# Patient Record
Sex: Female | Born: 1945 | Race: White | Hispanic: No | Marital: Married | Smoking: Former smoker
Health system: Southern US, Community
[De-identification: ages and names within clinical notes are randomized; demographics above are authoritative.]

## PROBLEM LIST (undated history)

## (undated) DIAGNOSIS — I82409 Acute embolism and thrombosis of unspecified deep veins of unspecified lower extremity: Secondary | ICD-10-CM

## (undated) DIAGNOSIS — F32A Depression, unspecified: Secondary | ICD-10-CM

## (undated) DIAGNOSIS — G629 Polyneuropathy, unspecified: Secondary | ICD-10-CM

## (undated) DIAGNOSIS — R112 Nausea with vomiting, unspecified: Secondary | ICD-10-CM

## (undated) DIAGNOSIS — M1712 Unilateral primary osteoarthritis, left knee: Secondary | ICD-10-CM

## (undated) DIAGNOSIS — M1990 Unspecified osteoarthritis, unspecified site: Secondary | ICD-10-CM

## (undated) DIAGNOSIS — J40 Bronchitis, not specified as acute or chronic: Secondary | ICD-10-CM

## (undated) DIAGNOSIS — K219 Gastro-esophageal reflux disease without esophagitis: Secondary | ICD-10-CM

## (undated) DIAGNOSIS — E785 Hyperlipidemia, unspecified: Secondary | ICD-10-CM

## (undated) DIAGNOSIS — F329 Major depressive disorder, single episode, unspecified: Secondary | ICD-10-CM

## (undated) DIAGNOSIS — J189 Pneumonia, unspecified organism: Secondary | ICD-10-CM

## (undated) DIAGNOSIS — C801 Malignant (primary) neoplasm, unspecified: Secondary | ICD-10-CM

## (undated) DIAGNOSIS — Z8719 Personal history of other diseases of the digestive system: Secondary | ICD-10-CM

## (undated) DIAGNOSIS — Z9889 Other specified postprocedural states: Secondary | ICD-10-CM

## (undated) HISTORY — PX: VAGINAL HYSTERECTOMY: SUR661

## (undated) HISTORY — PX: BREAST LUMPECTOMY: SHX2

## (undated) HISTORY — PX: TONSILLECTOMY: SUR1361

## (undated) HISTORY — PX: CHOLECYSTECTOMY: SHX55

## (undated) HISTORY — PX: BACK SURGERY: SHX140

## (undated) HISTORY — DX: Unilateral primary osteoarthritis, left knee: M17.12

## (undated) HISTORY — PX: JOINT REPLACEMENT: SHX530

---

## 2005-01-17 DIAGNOSIS — C801 Malignant (primary) neoplasm, unspecified: Secondary | ICD-10-CM

## 2005-01-17 HISTORY — DX: Malignant (primary) neoplasm, unspecified: C80.1

## 2007-08-27 ENCOUNTER — Inpatient Hospital Stay (HOSPITAL_COMMUNITY): Admission: RE | Admit: 2007-08-27 | Discharge: 2007-08-30 | Payer: Self-pay | Admitting: Orthopedic Surgery

## 2008-10-30 ENCOUNTER — Encounter: Admission: RE | Admit: 2008-10-30 | Discharge: 2008-10-30 | Payer: Self-pay | Admitting: Orthopedic Surgery

## 2009-07-03 ENCOUNTER — Ambulatory Visit (HOSPITAL_COMMUNITY): Admission: RE | Admit: 2009-07-03 | Discharge: 2009-07-03 | Payer: Self-pay | Admitting: Neurological Surgery

## 2009-09-14 ENCOUNTER — Ambulatory Visit (HOSPITAL_COMMUNITY): Admission: RE | Admit: 2009-09-14 | Discharge: 2009-09-15 | Payer: Self-pay | Admitting: Neurological Surgery

## 2010-02-07 ENCOUNTER — Encounter: Payer: Self-pay | Admitting: Orthopedic Surgery

## 2010-04-02 LAB — CBC
Hemoglobin: 14.1 g/dL (ref 12.0–15.0)
MCHC: 35 g/dL (ref 30.0–36.0)
MCV: 91.4 fL (ref 78.0–100.0)

## 2010-06-01 NOTE — Op Note (Signed)
Jeanne Cummings, Jeanne Cummings                ACCOUNT NO.:  [id number]   MEDICAL RECORD NO.:  [id number]          PATIENT TYPE:  INP   LOCATION:  5005                         FACILITY:  [facility]   PHYSICIAN:  Robert A. Thurston Hole, M.D. DATE OF BIRTH:  1945/08/23   DATE OF PROCEDURE:  08/27/2007  DATE OF DISCHARGE:                               OPERATIVE REPORT   PREOPERATIVE DIAGNOSIS:  Right knee degenerative joint disease.   POSTOPERATIVE DIAGNOSIS:  Right knee degenerative joint disease.   PROCEDURE:  Right total knee replacement using DePuy cemented total knee  system with #3 cemented femur, #4 cemented tibia with 12.5-mm  polyethylene RP tibial spacer and 35-mm polyethylene cemented patella.   SURGEON:  Elana Alm. Thurston Hole, MD   ASSISTANT:  Julien Girt, PA   ANESTHESIA:  General.   OPERATIVE TIME:  1 hour and 20 minutes.   COMPLICATIONS:  None.   DESCRIPTION OF PROCEDURE:  Jeanne Cummings was brought to the operating room  on August 27, 2007, after a femoral nerve block was placed in the  holding area by anesthesia.  She was placed on the operative table in  supine position.  Her right knee was examined under anesthesia.  Range  of motions from -5 to 125 degrees, mild varus deformity, and a stable  ligamentous exam with normal patellar tracking.  She had a Foley  catheter placed under sterile conditions.  Her right leg was then  prepped using sterile DuraPrep and draped using sterile technique.  She  received Ancef 2 grams IV preoperatively for prophylaxis.  The leg was  exsanguinated and a thigh tourniquet elevated to 375 mm.  Initially,  through a 16-cm longitudinal incision based over the patella, initial  exposure was made.  The underlying subcutaneous tissues were incised  along the skin incision.  A median arthrotomy was performed revealing an  excessive amount of normal-appearing joint fluid.  The articular  surfaces were inspected.  She had grade 4 changes medially, grade 3  changes laterally, and grade 3 and 4 changes in the patellofemoral  joint.  Osteophytes removed from the femoral condyles and tibial  plateau.  The medial and lateral meniscal remnants were removed as well  as the anterior cruciate ligament.  Intramedullary drill was then  drilled up the femoral canal for placement of distal femoral cutting  jig, which was placed in the appropriate manner by rotation and a distal  11 mm cut was made.  The distal femur was incised.  The #3 was found to  be the appropriate size.  The #3 cutting jig was placed in the  appropriate manner by external rotation and then these cute were made.  The proximal tibia was then exposed.  The tibial spines were removed  with an oscillating saw.  An intramedullary drill was then drilled down  the tibial canal for placement of proximal tibial cutting jig, which was  placed in the appropriate manner by rotation and a proximal tibial cut  was made taking 6-mm off the medial of lower side.  After this was done,  spacer blocks were placed in flexion  and extension.  The 12.5 mm blocks  gave excellent balancing, excellent stability, and excellent correction  of her flexion and varus deformities.  At this point, the #4 tibial base  plate trial was placed on the cut tibial surface with an excellent fit  and the keel cut was made.  The PCL box cutter was then placed on the  distal femur and these cuts were made.  At this point, the #3 femoral  trial was placed and with the #4 tibial base plate trial and a 12.5-mm  polyethylene RP tibial spacer, knee was reduced, taken through a range  of motion from 0-125 degrees of excellent stability and excellent  correction of her flexion, varus deformities and normal patellar  tracking.  Resurfacing 9-mm cut was then made for the patella and 3  locking holes placed for a 35-mm patella.  The patellar trial was  placed, again patellofemoral tracking was evaluated and found to be  normal.  At this  point, it was felt that all the trial components were  of excellent size, fit, and stability.  They were then removed.  The  knee was then jet lavaged, irrigated with 3 liters of saline.  The  proximal tibia was then exposed.  The #4 tibial base plate with cement  backing was hammered into position with an excellent fit with excess  cement being removed from around the edges.  The #3 femoral component  with cement backing was hammered into position also with an excellent  fit with excess cement being removed from around the edges.  The 12.5-mm  polyethylene RP tibial spacer was placed on the tibial base plate.  The  knee reduced, taken through a range of motion from 0-125 degrees with  excellent stability and excellent correction of her flexion and varus  deformities.  The 35-mm polyethylene cement backed patella was then  placed in its position and held there with a clamp.  After the cement  hardened, again, patellofemoral tracking was evaluated and found to be  normal.  At this point, it was felt that all the components were of  excellent size, fit, and stability.  The wound was further irrigated  with saline and then the tourniquet was released.  Hemostasis obtained  with cautery.  The arthrotomy was then closed with #1 Ethibond suture  over two medium Hemovac drains.  Subcutaneous tissue is closed with 0  and 2-0 Vicryl, subcuticular layer closed with 4-0 Monocryl.  Sterile  dressings and a long-leg splint applied.  The patient then awakened,  extubated, and taken to recovery room in stable condition.  Needle and  sponge counts were correct x2 at the end the case.  Neurovascular status  normal postoperatively as well.      Robert A. Thurston Hole, M.D.  Electronically Signed     RAW/MEDQ  D:  08/27/2007  T:  08/28/2007  Job:  [id number]

## 2010-06-04 NOTE — Discharge Summary (Signed)
NAMEDANIELE, DILLOW                ACCOUNT NO.:  [id number]   MEDICAL RECORD NO.:  [id number]          PATIENT TYPE:  INP   LOCATION:  5005                         FACILITY:  [facility]   PHYSICIAN:  Robert A. Thurston Hole, M.D. DATE OF BIRTH:  [birth year]   DATE OF ADMISSION:  08/27/2007  DATE OF DISCHARGE:  08/30/2007                               DISCHARGE SUMMARY   ADMITTING DIAGNOSES:  1. End-stage degenerative joint disease, right knee.  2. Depression.  3. History of breast cancer.  4. Hypertension.   DISCHARGE DIAGNOSES:  1. End-stage degenerative joint disease, right knee status post right      total knee replacement.  2. Depression.  3. History of breast cancer.  4. Hypertension.  5. Postoperative blood loss anemia.  6. Family history of heart disease.  7. Gastroesophageal reflux disease.  8. Obesity.  9. Hyperlipidemia.  10.History of a deep venous thrombosis.   HISTORY OF PRESENT ILLNESS:  The patient is a 65 year old white female  with a history of end-stage DJD of the right knee.  She has failed  conservative care including anti-inflammatories and intra-articular  cortisone injections.  Risks, benefits, and possible complications of a  right total knee replacement have been discussed with the patient and  her husband.  She is without question.   PROCEDURES:  In-house on August 27, 2007, the patient underwent a right  total knee replacement by Dr. Wyline Mood.  A right femoral nerve block by  anesthesia and Autovac transfusion.  She tolerated all 3 of these  procedures well and was admitted postoperatively for pain control, DVT  prophylaxis, and physical therapy.  Postop day 1, the patient's  hemoglobin was stable at 11.1.  INR was 1.1.  Temperature was 97.4.  Her  PCA was discontinued.  Her Foley was discontinued.  Her IV was saline  locked.  She got up with physical therapy, ambulated 50 feet, still  having a moderate amount of pain, unable to tolerate physical therapy in  the afternoon.  On postop day 2, the patient's hemoglobin was 10.3, INR  was 1.4, and she was metabolically stable and afebrile, tolerating a CPM  0-70 degrees.  She was given a Dulcolax suppository.  Physical therapy:  She was able to ambulate 50 feet in the morning, 80  feet in the afternoon.  On postop day 3, the patient significantly improved, alert and oriented,  pain under control with p.o. pain medicine.  She is being discharged to  home in stable condition, weightbearing as tolerated on a regular diet.   No instructions on daily dressing changes.  She will get home health  physical therapy, home health occupational therapy, and home health  nursing.  She will follow up in our office on September 10, 2007.      Kirstin Shepperson, P.A.      Robert A. Thurston Hole, M.D.  Electronically Signed    KS/MEDQ  D:  10/24/2007  T:  10/25/2007  Job:  [id number]

## 2010-09-27 ENCOUNTER — Other Ambulatory Visit (HOSPITAL_COMMUNITY): Payer: Self-pay

## 2010-10-04 ENCOUNTER — Inpatient Hospital Stay (HOSPITAL_COMMUNITY): Admission: RE | Admit: 2010-10-04 | Payer: Medicare Other | Source: Ambulatory Visit | Admitting: Neurological Surgery

## 2010-10-15 LAB — CBC
HCT: 29.6 — ABNORMAL LOW
HCT: 31.9 — ABNORMAL LOW
Hemoglobin: 10.3 — ABNORMAL LOW
MCHC: 34.8
MCHC: 34.9
MCV: 92.5
Platelets: 233
RBC: 3.26 — ABNORMAL LOW
RBC: 3.28 — ABNORMAL LOW
RDW: 12.6
WBC: 10.6 — ABNORMAL HIGH
WBC: 6.6
WBC: 7.4

## 2010-10-15 LAB — COMPREHENSIVE METABOLIC PANEL
Albumin: 3.8
CO2: 26
Calcium: 9.1
Chloride: 104
Creatinine, Ser: 1.23 — ABNORMAL HIGH
GFR calc Af Amer: 54 — ABNORMAL LOW
GFR calc non Af Amer: 44 — ABNORMAL LOW
Potassium: 3.8
Sodium: 137
Total Bilirubin: 0.4
Total Protein: 7

## 2010-10-15 LAB — URINE CULTURE

## 2010-10-15 LAB — BASIC METABOLIC PANEL
CO2: 28
CO2: 29
Calcium: 9.3
Chloride: 102
Chloride: 96
Creatinine, Ser: 0.98
GFR calc non Af Amer: 48 — ABNORMAL LOW
Potassium: 3.9
Potassium: 4.6
Sodium: 135

## 2010-10-15 LAB — URINALYSIS, ROUTINE W REFLEX MICROSCOPIC
Hgb urine dipstick: NEGATIVE
Protein, ur: NEGATIVE
Specific Gravity, Urine: 1.013
pH: 5.5

## 2010-10-15 LAB — DIFFERENTIAL
Eosinophils Relative: 4
Lymphocytes Relative: 32
Lymphs Abs: 1.6
Monocytes Absolute: 0.5
Monocytes Relative: 10
Neutro Abs: 2.7

## 2010-10-15 LAB — PROTIME-INR
INR: 0.9
INR: 1.4
Prothrombin Time: 19.1 — ABNORMAL HIGH

## 2010-10-15 LAB — TYPE AND SCREEN
ABO/RH(D): O POS
Antibody Screen: NEGATIVE

## 2010-10-15 LAB — ABO/RH: ABO/RH(D): O POS

## 2011-02-28 ENCOUNTER — Other Ambulatory Visit: Payer: Self-pay | Admitting: Neurological Surgery

## 2011-03-21 NOTE — Pre-Procedure Instructions (Signed)
20 Jeanne Cummings  03/21/2011   Your procedure is scheduled on:  Monday March 28, 2011 at 1129 am.  Report to [facility] at 0830  AM.  Call this number if you have problems the morning of surgery: [phone]   Remember:   Do not eat food:After Midnight.  May have clear liquids: up to 4 Hours before arrival until 0430 am.  Clear liquids include soda, tea, black coffee, apple or grape juice, broth.  Take these medicines the morning of surgery with A SIP OF WATER:    Do not wear jewelry, make-up or nail polish.  Do not wear lotions, powders, or perfumes. You may wear deodorant.  Do not shave 48 hours prior to surgery.  Do not bring valuables to the hospital.  Contacts, dentures or bridgework may not be worn into surgery.  Leave suitcase in the car. After surgery it may be brought to your room.  For patients admitted to the hospital, checkout time is 11:00 AM the day of discharge.   Patients discharged the day of surgery will not be allowed to drive home.  Name and phone number of your driver:   Special Instructions: CHG Shower Use Special Wash: 1/2 bottle night before surgery and 1/2 bottle morning of surgery.   Please read over the following fact sheets that you were given: Pain Booklet, Coughing and Deep Breathing, Blood Transfusion Information and Surgical Site Infection Prevention

## 2011-03-22 ENCOUNTER — Encounter (HOSPITAL_COMMUNITY): Payer: Self-pay

## 2011-03-22 ENCOUNTER — Encounter (HOSPITAL_COMMUNITY)
Admission: RE | Admit: 2011-03-22 | Discharge: 2011-03-22 | Disposition: A | Discharge status: Home / Self Care | Payer: Medicare Other | Source: Ambulatory Visit | Attending: Neurological Surgery | Admitting: Neurological Surgery

## 2011-03-22 DIAGNOSIS — Z01818 Encounter for other preprocedural examination: Secondary | ICD-10-CM | POA: Insufficient documentation

## 2011-03-22 DIAGNOSIS — Z01812 Encounter for preprocedural laboratory examination: Secondary | ICD-10-CM | POA: Insufficient documentation

## 2011-03-22 HISTORY — DX: Bronchitis, not specified as acute or chronic: J40

## 2011-03-22 HISTORY — DX: Other specified postprocedural states: Z98.890

## 2011-03-22 HISTORY — DX: Personal history of other diseases of the digestive system: Z87.19

## 2011-03-22 HISTORY — DX: Depression, unspecified: F32.A

## 2011-03-22 HISTORY — DX: Gastro-esophageal reflux disease without esophagitis: K21.9

## 2011-03-22 HISTORY — DX: Acute embolism and thrombosis of unspecified deep veins of unspecified lower extremity: I82.409

## 2011-03-22 HISTORY — DX: Polyneuropathy, unspecified: G62.9

## 2011-03-22 HISTORY — DX: Other specified postprocedural states: R11.2

## 2011-03-22 HISTORY — DX: Major depressive disorder, single episode, unspecified: F32.9

## 2011-03-22 HISTORY — DX: Hyperlipidemia, unspecified: E78.5

## 2011-03-22 HISTORY — DX: Pneumonia, unspecified organism: J18.9

## 2011-03-22 HISTORY — DX: Malignant (primary) neoplasm, unspecified: C80.1

## 2011-03-22 HISTORY — DX: Unspecified osteoarthritis, unspecified site: M19.90

## 2011-03-22 LAB — CBC
MCV: 89.8 fL (ref 78.0–100.0)
Platelets: 283 10*3/uL (ref 150–400)
RBC: 4.5 MIL/uL (ref 3.87–5.11)
WBC: 7.9 10*3/uL (ref 4.0–10.5)

## 2011-03-22 LAB — TYPE AND SCREEN

## 2011-03-22 LAB — SURGICAL PCR SCREEN: Staphylococcus aureus: NEGATIVE

## 2011-03-22 NOTE — Progress Notes (Signed)
Pt informed Nurse that she had a stress test, EKG, and Echo at [facility] in [place], [place] but all results were at PCP office Dr. Reed Breech. Results requested. EKG and Chest Xray were not ordered due to pts denial of having any cardiac or lung issues and also because pt was not currently on any medications for cardiac or lung disease.

## 2011-03-23 NOTE — Progress Notes (Signed)
Spoke with Tabitha at Dr Ingledue's office and she said she could not find any cardiac records on this pt.  After informing her that the records were originally sent from a clinic in [place], she stated she will search further and try and locate them.

## 2011-03-28 ENCOUNTER — Encounter (HOSPITAL_COMMUNITY): Admission: RE | Payer: Self-pay | Source: Ambulatory Visit

## 2011-03-28 ENCOUNTER — Inpatient Hospital Stay (HOSPITAL_COMMUNITY): Admission: RE | Admit: 2011-03-28 | Payer: Medicare Other | Source: Ambulatory Visit | Admitting: Neurological Surgery

## 2011-03-28 SURGERY — POSTERIOR LUMBAR FUSION 3 LEVEL
Anesthesia: General | Site: Back

## 2011-04-20 ENCOUNTER — Encounter (HOSPITAL_COMMUNITY): Payer: Self-pay | Admitting: Pharmacy Technician

## 2011-04-21 ENCOUNTER — Other Ambulatory Visit: Payer: Self-pay | Admitting: Physician Assistant

## 2011-04-21 ENCOUNTER — Encounter: Payer: Self-pay | Admitting: Physician Assistant

## 2011-04-21 DIAGNOSIS — J40 Bronchitis, not specified as acute or chronic: Secondary | ICD-10-CM

## 2011-04-21 DIAGNOSIS — G629 Polyneuropathy, unspecified: Secondary | ICD-10-CM

## 2011-04-21 DIAGNOSIS — F32A Depression, unspecified: Secondary | ICD-10-CM

## 2011-04-21 DIAGNOSIS — M1990 Unspecified osteoarthritis, unspecified site: Secondary | ICD-10-CM

## 2011-04-21 DIAGNOSIS — Z8719 Personal history of other diseases of the digestive system: Secondary | ICD-10-CM

## 2011-04-21 DIAGNOSIS — K219 Gastro-esophageal reflux disease without esophagitis: Secondary | ICD-10-CM

## 2011-04-21 DIAGNOSIS — J189 Pneumonia, unspecified organism: Secondary | ICD-10-CM

## 2011-04-21 DIAGNOSIS — F329 Major depressive disorder, single episode, unspecified: Secondary | ICD-10-CM

## 2011-04-21 DIAGNOSIS — C801 Malignant (primary) neoplasm, unspecified: Secondary | ICD-10-CM

## 2011-04-21 DIAGNOSIS — R112 Nausea with vomiting, unspecified: Secondary | ICD-10-CM

## 2011-04-21 DIAGNOSIS — Z9889 Other specified postprocedural states: Secondary | ICD-10-CM

## 2011-04-21 DIAGNOSIS — I82409 Acute embolism and thrombosis of unspecified deep veins of unspecified lower extremity: Secondary | ICD-10-CM

## 2011-04-21 DIAGNOSIS — E785 Hyperlipidemia, unspecified: Secondary | ICD-10-CM

## 2011-04-21 DIAGNOSIS — M1712 Unilateral primary osteoarthritis, left knee: Secondary | ICD-10-CM

## 2011-04-21 NOTE — H&P (Signed)
ANEA FODERA is an 66 y.o. female.   Chief Complaint: left knee end stage DJD HPI: Doshie is a 66 year old who comes in for evaluation of her left knee. She has a longstanding history of left knee degenerative joint disease. She's tried interarticular cortisone and Supartz injections. She's using a cane to ambulate due to significant pain and weakness. She requires assistance to rise from chair and has difficulty sleeping at night.  The excruciating pain is getting progressively worse, limiting activities of daily living, poses a significant fall risk, and has failed multiple conservative treatments including intraarticular cortisone injections, intraarticular Supartz, bracing, medication and home physical therapy.  Past Medical History  Diagnosis Date  . PONV (postoperative nausea and vomiting)   . DVT, lower extremity     Left leg  . Bronchitis     hx of  . Pneumonia     hx of  . Depression   . Cancer 2007    Beast cancer. Chemo & radiation  . H/O hiatal hernia   . GERD (gastroesophageal reflux disease)     Takes Zantac  . Constipation     hx of  . Neuropathy     Bilateral feet  . Arthritis   . Hyperlipidemia     No meds due to adverse affects  . Left knee DJD     Past Surgical History  Procedure Date  . Vaginal hysterectomy   . Tonsillectomy   . Joint replacement     right knee replacement  . Back surgery     Cervical surgery  . Breast lumpectomy     x 3    Family History  Problem Relation Age of Onset  . Anesthesia problems Neg Hx   . Hypotension Neg Hx   . Malignant hyperthermia Neg Hx   . Pseudochol deficiency Neg Hx   . Bone cancer Father   . Arthritis Sister   . Fibromyalgia Sister   . Interstitial cystitis Sister    Social History:  reports that she quit smoking about 31 years ago. She has never used smokeless tobacco. She reports that she drinks about 1.8 ounces of alcohol per week. She reports that she does not use illicit drugs.  Allergies:    Allergies  Allergen Reactions  . Lidocaine   . Novacare Or   . Oxycodone-Acetaminophen   . Reglan     Medications Prior to Admission  Medication Sig Dispense Refill  . Cyanocobalamin (VITAMIN B 12 PO) Inject 5 mg as directed every 30 (thirty) days.      . diclofenac-misoprostol (ARTHROTEC 75) 75-200 MG-MCG per tablet Take 1 tablet by mouth 2 (two) times daily.      Marland Kitchen FOLIC ACID PO Take 1 tablet by mouth daily. 600 mcg      . magnesium oxide (MAG-OX) 400 MG tablet Take 400 mg by mouth every evening.      . RABEprazole (ACIPHEX) 20 MG tablet Take 20 mg by mouth every evening.      . ranitidine (ZANTAC) 150 MG tablet Take 150 mg by mouth every morning.      . venlafaxine (EFFEXOR-XR) 75 MG 24 hr capsule Take 75 mg by mouth 2 (two) times daily.      . Vitamin D, Ergocalciferol, (DRISDOL) 50000 UNITS CAPS Take 50,000 Units by mouth every 7 (seven) days. thursdays      . OVER THE COUNTER MEDICATION Take 5 tablets by mouth daily. Organic triple fiber pills.  Pt takes all 5 tabs  at once       No current facility-administered medications on file as of 04/21/2011.    No results found for this or any previous visit (from the past 48 hour(s)). No results found.  Review of Systems  Constitutional: Negative.   HENT: Negative.   Eyes: Negative.   Respiratory: Negative.   Cardiovascular: Negative.   Gastrointestinal: Positive for heartburn and constipation. Negative for nausea, vomiting, abdominal pain, diarrhea, blood in stool and melena.  Genitourinary: Negative.   Musculoskeletal: Positive for back pain and joint pain.       DDD back    Left knee pain  Skin: Negative.   Neurological: Negative.   Endo/Heme/Allergies: Positive for polydipsia. Negative for environmental allergies. Does not bruise/bleed easily.  Psychiatric/Behavioral: Negative.     Blood pressure 153/99, pulse 78, temperature 98.3 F (36.8 C), height 5\' 4"  (1.626 m), weight 105.235 kg (232 lb), SpO2 98.00%. Physical Exam   Constitutional: She is oriented to person, place, and time. She appears well-developed and well-nourished.  HENT:  Head: Normocephalic and atraumatic.  Mouth/Throat: Oropharynx is clear and moist.  Eyes: EOM are normal. Pupils are equal, round, and reactive to light.  Neck: Normal range of motion. Neck supple.  Cardiovascular: Normal rate and regular rhythm.   Respiratory: Effort normal and breath sounds normal.  GI: Soft. Bowel sounds are normal.  Genitourinary:       Not pertinent to current symptomatology therefore not examined.  Musculoskeletal: Normal range of motion.       Left knee has active range of motion -10 to 95 degrees 3+ crepitus 2+ synovitis medial joint line tenderness she has ipsilateral straight leg raise on the left with left leg radiculopathy she has left quad weakness compared to the right quad with 4/5 strength on the left knee right knee has 5/5 strength right knee has active range of motion 0-120 degrees well healed total knee incision 2+ dorsalis pedis pulses. She has decreased sensation in the left lower extremity compared to the right but intact to light touch.  Neurological: She is alert and oriented to person, place, and time.  Skin: Skin is warm and dry.  Psychiatric: She has a normal mood and affect. Her behavior is normal. Judgment and thought content normal.      Xray: AP,LATERAL, FLEXION, and SUNRISE views of the left knee show significant joint space narrowing, with periarticular osteophytes, and subchondral sclerosis.  Assessment Past Medical History  Diagnosis Date  . PONV (postoperative nausea and vomiting)   . DVT, lower extremity     Left leg  . Bronchitis     hx of  . Pneumonia     hx of  . Depression   . Cancer 2007    Beast cancer. Chemo & radiation  . H/O hiatal hernia   . GERD (gastroesophageal reflux disease)     Takes Zantac  . Constipation     hx of  . Neuropathy     Bilateral feet  . Arthritis   . Hyperlipidemia     No  meds due to adverse affects  . Left knee DJD      Plan At this point in time, she has significant antalgic gait ambulates with assistance of a cane has failed conservative treatment with injections and Supartz as well as home physical therapy we recommend left total knee replacement. Prior to total knee replacement she would be a excellent candidate for epidural steroid injections on the left or nerve block to control left  leg radiculopathy.   Aitanna Haubner J 04/21/2011, 11:27 AM

## 2011-04-28 ENCOUNTER — Encounter (HOSPITAL_COMMUNITY)
Admission: RE | Admit: 2011-04-28 | Discharge: 2011-04-28 | Disposition: A | Discharge status: Home / Self Care | Payer: Medicare Other | Source: Ambulatory Visit | Attending: Orthopedic Surgery | Admitting: Orthopedic Surgery

## 2011-04-28 ENCOUNTER — Encounter (HOSPITAL_COMMUNITY): Payer: Self-pay

## 2011-04-28 ENCOUNTER — Encounter (HOSPITAL_COMMUNITY)
Admission: RE | Admit: 2011-04-28 | Discharge: 2011-04-28 | Disposition: A | Discharge status: Home / Self Care | Payer: Medicare Other | Source: Ambulatory Visit | Attending: Physician Assistant | Admitting: Physician Assistant

## 2011-04-28 LAB — COMPREHENSIVE METABOLIC PANEL
Albumin: 3.6 g/dL (ref 3.5–5.2)
BUN: 20 mg/dL (ref 6–23)
Calcium: 9.2 mg/dL (ref 8.4–10.5)
Chloride: 99 mEq/L (ref 96–112)
Creatinine, Ser: 1.03 mg/dL (ref 0.50–1.10)
Total Bilirubin: 0.3 mg/dL (ref 0.3–1.2)
Total Protein: 6.7 g/dL (ref 6.0–8.3)

## 2011-04-28 LAB — URINALYSIS, ROUTINE W REFLEX MICROSCOPIC
Glucose, UA: NEGATIVE mg/dL
Hgb urine dipstick: NEGATIVE
Ketones, ur: NEGATIVE mg/dL
Leukocytes, UA: NEGATIVE
Protein, ur: NEGATIVE mg/dL

## 2011-04-28 LAB — DIFFERENTIAL
Basophils Relative: 0 % (ref 0–1)
Monocytes Relative: 9 % (ref 3–12)
Neutro Abs: 4.3 10*3/uL (ref 1.7–7.7)
Neutrophils Relative %: 58 % (ref 43–77)

## 2011-04-28 LAB — PROTIME-INR: INR: 0.93 (ref 0.00–1.49)

## 2011-04-28 LAB — CBC
HCT: 41.8 % (ref 36.0–46.0)
MCH: 30.5 pg (ref 26.0–34.0)
MCHC: 33.7 g/dL (ref 30.0–36.0)
MCV: 90.3 fL (ref 78.0–100.0)
RDW: 13.1 % (ref 11.5–15.5)

## 2011-04-28 LAB — TYPE AND SCREEN: ABO/RH(D): O POS

## 2011-04-28 NOTE — Pre-Procedure Instructions (Signed)
20 Jeanne Cummings  04/28/2011   Your procedure is scheduled on:  05/02/11  Report to [facility] at 830 AM.  Call this number if you have problems the morning of surgery: [phone]   Remember:   Do not eat food:After Midnight.  May have clear liquids: up to 4 Hours before arrival.  Clear liquids include soda, tea, black coffee, apple or grape juice, broth.  Take these medicines the morning of surgery with A SIP OF WATER: *aciphex,zantac,effexor  Do not wear jewelry, make-up or nail polish.  Do not wear lotions, powders, or perfumes. You may wear deodorant.  Do not shave 48 hours prior to surgery.  Do not bring valuables to the hospital.  Contacts, dentures or bridgework may not be worn into surgery.  Leave suitcase in the car. After surgery it may be brought to your room.  For patients admitted to the hospital, checkout time is 11:00 AM the day of discharge.   Patients discharged the day of surgery will not be allowed to drive home.  Name and phone number of your driver: family  Special Instructions: CHG Shower Use Special Wash: 1/2 bottle night before surgery and 1/2 bottle morning of surgery.   Please read over the following fact sheets that you were given: Pain Booklet, Coughing and Deep Breathing, Blood Transfusion Information, MRSA Information and Surgical Site Infection Prevention

## 2011-04-29 LAB — URINE CULTURE: Culture  Setup Time: 201304111540

## 2011-04-29 NOTE — Consult Note (Signed)
Anesthesia:  Patient is a 66 year old female scheduled for left TKR on 05/02/11.  History includes former smoker, post-operative N/V, depression, HH, arthritis, GERD, HLD, history of PNA and bronchitis, breast cancer s/p chemo/radiation '07, history of LLE DVT, peripheral neuropathy, obesity with BMI of 40. S/p C5-6 ACDF August 2011.  Her BP was elevated at 153/99 at her Ortho appointment last week, but was improved at 132/78 at her PAT appointment.  She did not report a history of HTN.  Labs noted.  Urine culture is still pending.    CXR on 04/28/11 showed no active cardiopulmonary disease.  EKG on 04/28/11 showed NSR, poor r wave progression.  It was not felt significantly changed since 09/14/09.  Anticipate she can proceed as planned.

## 2011-05-01 MED ORDER — POVIDONE-IODINE 7.5 % EX SOLN
Freq: Once | CUTANEOUS | Status: DC
  Filled 2011-05-01: qty 118

## 2011-05-01 MED ORDER — CEFAZOLIN SODIUM-DEXTROSE 2-3 GM-% IV SOLR
2.0000 g | INTRAVENOUS | Status: AC
  Administered 2011-05-02: 2 g via INTRAVENOUS
  Filled 2011-05-01: qty 50

## 2011-05-01 MED ORDER — CHLORHEXIDINE GLUCONATE 4 % EX LIQD: 60.0000 mL | Freq: Once | CUTANEOUS | Status: DC

## 2011-05-02 ENCOUNTER — Ambulatory Visit (HOSPITAL_COMMUNITY): Payer: Medicare Other | Admitting: Vascular Surgery

## 2011-05-02 ENCOUNTER — Encounter (HOSPITAL_COMMUNITY): Payer: Self-pay | Admitting: *Deleted

## 2011-05-02 ENCOUNTER — Encounter (HOSPITAL_COMMUNITY): Payer: Self-pay | Admitting: Vascular Surgery

## 2011-05-02 ENCOUNTER — Inpatient Hospital Stay (HOSPITAL_COMMUNITY)
Admission: RE | Admit: 2011-05-02 | Discharge: 2011-05-05 | DRG: 470 | Disposition: A | Discharge status: Home Health | Payer: Medicare Other | Source: Ambulatory Visit | Attending: Orthopedic Surgery | Admitting: Orthopedic Surgery

## 2011-05-02 ENCOUNTER — Encounter (HOSPITAL_COMMUNITY)
Admission: RE | Disposition: A | Discharge status: Home Health | Payer: Self-pay | Source: Ambulatory Visit | Attending: Orthopedic Surgery

## 2011-05-02 DIAGNOSIS — M1990 Unspecified osteoarthritis, unspecified site: Secondary | ICD-10-CM | POA: Diagnosis present

## 2011-05-02 DIAGNOSIS — Z853 Personal history of malignant neoplasm of breast: Secondary | ICD-10-CM

## 2011-05-02 DIAGNOSIS — I82409 Acute embolism and thrombosis of unspecified deep veins of unspecified lower extremity: Secondary | ICD-10-CM | POA: Clinically undetermined

## 2011-05-02 DIAGNOSIS — Z01812 Encounter for preprocedural laboratory examination: Secondary | ICD-10-CM

## 2011-05-02 DIAGNOSIS — M1712 Unilateral primary osteoarthritis, left knee: Secondary | ICD-10-CM | POA: Diagnosis present

## 2011-05-02 DIAGNOSIS — Z9889 Other specified postprocedural states: Secondary | ICD-10-CM | POA: Insufficient documentation

## 2011-05-02 DIAGNOSIS — G589 Mononeuropathy, unspecified: Secondary | ICD-10-CM | POA: Diagnosis present

## 2011-05-02 DIAGNOSIS — J385 Laryngeal spasm: Secondary | ICD-10-CM | POA: Diagnosis present

## 2011-05-02 DIAGNOSIS — I10 Essential (primary) hypertension: Secondary | ICD-10-CM | POA: Diagnosis present

## 2011-05-02 DIAGNOSIS — E785 Hyperlipidemia, unspecified: Secondary | ICD-10-CM | POA: Diagnosis present

## 2011-05-02 DIAGNOSIS — D62 Acute posthemorrhagic anemia: Secondary | ICD-10-CM | POA: Diagnosis not present

## 2011-05-02 DIAGNOSIS — F3289 Other specified depressive episodes: Secondary | ICD-10-CM | POA: Diagnosis present

## 2011-05-02 DIAGNOSIS — F329 Major depressive disorder, single episode, unspecified: Secondary | ICD-10-CM | POA: Diagnosis present

## 2011-05-02 DIAGNOSIS — G629 Polyneuropathy, unspecified: Secondary | ICD-10-CM | POA: Diagnosis present

## 2011-05-02 DIAGNOSIS — K219 Gastro-esophageal reflux disease without esophagitis: Secondary | ICD-10-CM | POA: Diagnosis present

## 2011-05-02 DIAGNOSIS — M1710 Unilateral primary osteoarthritis, unspecified knee: Principal | ICD-10-CM | POA: Diagnosis present

## 2011-05-02 DIAGNOSIS — R609 Edema, unspecified: Secondary | ICD-10-CM | POA: Diagnosis present

## 2011-05-02 HISTORY — PX: TOTAL KNEE ARTHROPLASTY: SHX125

## 2011-05-02 SURGERY — ARTHROPLASTY, KNEE, TOTAL
Anesthesia: General | Site: Knee | Laterality: Left | Wound class: Clean

## 2011-05-02 MED ORDER — DEXAMETHASONE SODIUM PHOSPHATE 4 MG/ML IJ SOLN
INTRAMUSCULAR | Status: DC | PRN
  Administered 2011-05-02: 8 mg via INTRAVENOUS

## 2011-05-02 MED ORDER — PHENOL 1.4 % MT LIQD
1.0000 | OROMUCOSAL | Status: DC | PRN
  Administered 2011-05-03: 1 via OROMUCOSAL
  Filled 2011-05-02: qty 177

## 2011-05-02 MED ORDER — DROPERIDOL 2.5 MG/ML IJ SOLN
INTRAMUSCULAR | Status: DC | PRN
  Administered 2011-05-02: 0.625 mg via INTRAVENOUS

## 2011-05-02 MED ORDER — FENTANYL CITRATE 0.05 MG/ML IJ SOLN
INTRAMUSCULAR | Status: DC | PRN
  Administered 2011-05-02 (×2): 50 ug via INTRAVENOUS
  Administered 2011-05-02 (×2): 100 ug via INTRAVENOUS

## 2011-05-02 MED ORDER — WARFARIN SODIUM 5 MG PO TABS
5.0000 mg | ORAL_TABLET | Freq: Once | ORAL | Status: AC
  Administered 2011-05-02: 5 mg via ORAL
  Filled 2011-05-02: qty 1

## 2011-05-02 MED ORDER — NEOSTIGMINE METHYLSULFATE 1 MG/ML IJ SOLN
INTRAMUSCULAR | Status: DC | PRN
  Administered 2011-05-02: 2 mg via INTRAVENOUS

## 2011-05-02 MED ORDER — ONDANSETRON HCL 4 MG/2ML IJ SOLN
INTRAMUSCULAR | Status: DC | PRN
  Administered 2011-05-02: 4 mg via INTRAVENOUS

## 2011-05-02 MED ORDER — ACETAMINOPHEN 650 MG RE SUPP: 650.0000 mg | Freq: Four times a day (QID) | RECTAL | Status: DC | PRN

## 2011-05-02 MED ORDER — ESMOLOL HCL 10 MG/ML IV SOLN
INTRAVENOUS | Status: DC | PRN
  Administered 2011-05-02: 10 mg via INTRAVENOUS
  Administered 2011-05-02: 20 mg via INTRAVENOUS
  Administered 2011-05-02 (×2): 10 mg via INTRAVENOUS
  Administered 2011-05-02: 20 mg via INTRAVENOUS
  Administered 2011-05-02 (×2): 10 mg via INTRAVENOUS
  Administered 2011-05-02: 20 mg via INTRAVENOUS

## 2011-05-02 MED ORDER — PROMETHAZINE HCL 25 MG/ML IJ SOLN: 6.2500 mg | Freq: Once | INTRAMUSCULAR | Status: DC

## 2011-05-02 MED ORDER — ACETAMINOPHEN 10 MG/ML IV SOLN
INTRAVENOUS | Status: AC
  Filled 2011-05-02: qty 100

## 2011-05-02 MED ORDER — HYDROMORPHONE HCL PF 1 MG/ML IJ SOLN: 1.0000 mg | INTRAMUSCULAR | Status: DC | PRN

## 2011-05-02 MED ORDER — MAGNESIUM OXIDE 400 MG PO TABS
400.0000 mg | ORAL_TABLET | Freq: Every evening | ORAL | Status: DC
  Administered 2011-05-02 – 2011-05-04 (×3): 400 mg via ORAL
  Filled 2011-05-02 (×4): qty 1

## 2011-05-02 MED ORDER — METOCLOPRAMIDE HCL 5 MG/ML IJ SOLN: 5.0000 mg | Freq: Three times a day (TID) | INTRAMUSCULAR | Status: DC | PRN

## 2011-05-02 MED ORDER — ONDANSETRON HCL 4 MG PO TABS: 4.0000 mg | ORAL_TABLET | Freq: Four times a day (QID) | ORAL | Status: DC | PRN

## 2011-05-02 MED ORDER — WARFARIN - PHARMACIST DOSING INPATIENT: Freq: Every day | Status: DC

## 2011-05-02 MED ORDER — ROCURONIUM BROMIDE 100 MG/10ML IV SOLN
INTRAVENOUS | Status: DC | PRN
  Administered 2011-05-02: 50 mg via INTRAVENOUS

## 2011-05-02 MED ORDER — LACTATED RINGERS IV SOLN: INTRAVENOUS | Status: DC

## 2011-05-02 MED ORDER — ONDANSETRON HCL 4 MG/2ML IJ SOLN: 4.0000 mg | Freq: Four times a day (QID) | INTRAMUSCULAR | Status: DC | PRN

## 2011-05-02 MED ORDER — SODIUM CHLORIDE 0.9 % IR SOLN
Status: DC | PRN
  Administered 2011-05-02: 1000 mL

## 2011-05-02 MED ORDER — MENTHOL 3 MG MT LOZG
1.0000 | LOZENGE | OROMUCOSAL | Status: DC | PRN
  Filled 2011-05-02: qty 9

## 2011-05-02 MED ORDER — HYDROMORPHONE HCL PF 1 MG/ML IJ SOLN
INTRAMUSCULAR | Status: AC
  Filled 2011-05-02: qty 1

## 2011-05-02 MED ORDER — HYDRALAZINE HCL 20 MG/ML IJ SOLN
2.0000 mg | Freq: Once | INTRAMUSCULAR | Status: DC
  Filled 2011-05-02: qty 0.1

## 2011-05-02 MED ORDER — HYDROMORPHONE HCL PF 1 MG/ML IJ SOLN
0.2500 mg | INTRAMUSCULAR | Status: DC | PRN
  Administered 2011-05-02 (×5): 0.5 mg via INTRAVENOUS

## 2011-05-02 MED ORDER — PANTOPRAZOLE SODIUM 40 MG PO TBEC
40.0000 mg | DELAYED_RELEASE_TABLET | Freq: Every day | ORAL | Status: DC
  Administered 2011-05-02 – 2011-05-03 (×2): 40 mg via ORAL
  Filled 2011-05-02 (×2): qty 1

## 2011-05-02 MED ORDER — ACETAMINOPHEN 10 MG/ML IV SOLN
INTRAVENOUS | Status: DC | PRN
  Administered 2011-05-02: 1000 mg via INTRAVENOUS

## 2011-05-02 MED ORDER — GLYCOPYRROLATE 0.2 MG/ML IJ SOLN
INTRAMUSCULAR | Status: DC | PRN
  Administered 2011-05-02: .2 mg via INTRAVENOUS

## 2011-05-02 MED ORDER — MIDAZOLAM HCL 5 MG/5ML IJ SOLN
INTRAMUSCULAR | Status: DC | PRN
  Administered 2011-05-02: 2 mg via INTRAVENOUS

## 2011-05-02 MED ORDER — PROPOFOL 10 MG/ML IV EMUL
INTRAVENOUS | Status: DC | PRN
  Administered 2011-05-02: 80 mg via INTRAVENOUS
  Administered 2011-05-02: 120 mg via INTRAVENOUS

## 2011-05-02 MED ORDER — VITAMIN D (ERGOCALCIFEROL) 1.25 MG (50000 UNIT) PO CAPS
50000.0000 [IU] | ORAL_CAPSULE | ORAL | Status: DC
  Administered 2011-05-05: 50000 [IU] via ORAL
  Filled 2011-05-02: qty 1

## 2011-05-02 MED ORDER — CEFAZOLIN SODIUM-DEXTROSE 2-3 GM-% IV SOLR
2.0000 g | Freq: Four times a day (QID) | INTRAVENOUS | Status: AC
  Administered 2011-05-02 – 2011-05-03 (×3): 2 g via INTRAVENOUS
  Filled 2011-05-02 (×3): qty 50

## 2011-05-02 MED ORDER — FAMOTIDINE 10 MG PO TABS: 10.0000 mg | ORAL_TABLET | Freq: Two times a day (BID) | ORAL | Status: DC

## 2011-05-02 MED ORDER — CEFUROXIME SODIUM 1.5 G IJ SOLR
INTRAMUSCULAR | Status: DC | PRN
  Administered 2011-05-02: 1.5 g

## 2011-05-02 MED ORDER — METOCLOPRAMIDE HCL 10 MG PO TABS
5.0000 mg | ORAL_TABLET | Freq: Three times a day (TID) | ORAL | Status: DC | PRN
  Administered 2011-05-04 (×2): 10 mg via ORAL
  Filled 2011-05-02 (×2): qty 1

## 2011-05-02 MED ORDER — ACETAMINOPHEN 325 MG PO TABS: 650.0000 mg | ORAL_TABLET | Freq: Four times a day (QID) | ORAL | Status: DC | PRN

## 2011-05-02 MED ORDER — VENLAFAXINE HCL ER 75 MG PO CP24
75.0000 mg | ORAL_CAPSULE | Freq: Two times a day (BID) | ORAL | Status: DC
  Administered 2011-05-02 – 2011-05-05 (×6): 75 mg via ORAL
  Filled 2011-05-02 (×7): qty 1

## 2011-05-02 MED ORDER — HYDROCODONE-ACETAMINOPHEN 10-325 MG PO TABS
1.0000 | ORAL_TABLET | ORAL | Status: DC | PRN
  Administered 2011-05-02 – 2011-05-03 (×3): 2 via ORAL
  Filled 2011-05-02 (×2): qty 2

## 2011-05-02 MED ORDER — ZOLPIDEM TARTRATE 5 MG PO TABS: 5.0000 mg | ORAL_TABLET | Freq: Every evening | ORAL | Status: DC | PRN

## 2011-05-02 MED ORDER — LABETALOL HCL 5 MG/ML IV SOLN
INTRAVENOUS | Status: DC | PRN
  Administered 2011-05-02: 5 mg via INTRAVENOUS

## 2011-05-02 MED ORDER — CEFAZOLIN SODIUM 1-5 GM-% IV SOLN: 1.0000 g | INTRAVENOUS | Status: DC

## 2011-05-02 MED ORDER — BUPIVACAINE-EPINEPHRINE PF 0.5-1:200000 % IJ SOLN
INTRAMUSCULAR | Status: DC | PRN
  Administered 2011-05-02: 30 mL

## 2011-05-02 MED ORDER — WARFARIN VIDEO: Freq: Once | Status: DC

## 2011-05-02 MED ORDER — SORBITOL 70 % SOLN
30.0000 mL | Freq: Two times a day (BID) | Status: DC
  Administered 2011-05-02 – 2011-05-05 (×6): 30 mL via ORAL
  Filled 2011-05-02 (×8): qty 30

## 2011-05-02 MED ORDER — PATIENT'S GUIDE TO USING COUMADIN BOOK
Freq: Once | Status: DC
  Filled 2011-05-02: qty 1

## 2011-05-02 MED ORDER — HYDROMORPHONE HCL PF 1 MG/ML IJ SOLN
0.5000 mg | INTRAMUSCULAR | Status: DC | PRN
  Administered 2011-05-02 – 2011-05-04 (×8): 1 mg via INTRAVENOUS
  Filled 2011-05-02 (×9): qty 1

## 2011-05-02 MED ORDER — LACTATED RINGERS IV SOLN
INTRAVENOUS | Status: DC | PRN
  Administered 2011-05-02 (×2): via INTRAVENOUS

## 2011-05-02 MED ORDER — ENOXAPARIN SODIUM 30 MG/0.3ML ~~LOC~~ SOLN
30.0000 mg | Freq: Two times a day (BID) | SUBCUTANEOUS | Status: DC
  Administered 2011-05-03 – 2011-05-05 (×5): 30 mg via SUBCUTANEOUS
  Filled 2011-05-02 (×7): qty 0.3

## 2011-05-02 MED ORDER — HYDROCODONE-ACETAMINOPHEN 5-325 MG PO TABS
ORAL_TABLET | ORAL | Status: AC
  Filled 2011-05-02: qty 2

## 2011-05-02 SURGICAL SUPPLY — 71 items
BANDAGE ELASTIC 6 VELCRO ST LF (GAUZE/BANDAGES/DRESSINGS) ×2 IMPLANT
BANDAGE ESMARK 6X9 LF (GAUZE/BANDAGES/DRESSINGS) ×1 IMPLANT
BLADE SAGITTAL 25.0X1.19X90 (BLADE) ×2 IMPLANT
BLADE SAW SGTL 11.0X1.19X90.0M (BLADE) ×2 IMPLANT
BLADE SAW SGTL 13.0X1.19X90.0M (BLADE) ×2 IMPLANT
BLADE SURG 10 STRL SS (BLADE) ×4 IMPLANT
BNDG ELASTIC 6X15 VLCR STRL LF (GAUZE/BANDAGES/DRESSINGS) ×2 IMPLANT
BNDG ESMARK 6X9 LF (GAUZE/BANDAGES/DRESSINGS) ×2
BOWL SMART MIX CTS (DISPOSABLE) ×2 IMPLANT
CEMENT HV SMART SET (Cement) ×4 IMPLANT
CLOTH BEACON ORANGE TIMEOUT ST (SAFETY) ×2 IMPLANT
CLSR STERI-STRIP ANTIMIC 1/2X4 (GAUZE/BANDAGES/DRESSINGS) ×2 IMPLANT
COVER BACK TABLE 24X17X13 BIG (DRAPES) IMPLANT
COVER PROBE W GEL 5X96 (DRAPES) IMPLANT
COVER SURGICAL LIGHT HANDLE (MISCELLANEOUS) ×2 IMPLANT
CUFF TOURNIQUET SINGLE 34IN LL (TOURNIQUET CUFF) ×2 IMPLANT
CUFF TOURNIQUET SINGLE 44IN (TOURNIQUET CUFF) IMPLANT
DRAPE EXTREMITY T 121X128X90 (DRAPE) ×2 IMPLANT
DRAPE INCISE IOBAN 66X45 STRL (DRAPES) ×2 IMPLANT
DRAPE PROXIMA HALF (DRAPES) ×2 IMPLANT
DRAPE U-SHAPE 47X51 STRL (DRAPES) ×2 IMPLANT
DRSG ADAPTIC 3X8 NADH LF (GAUZE/BANDAGES/DRESSINGS) ×2 IMPLANT
DRSG PAD ABDOMINAL 8X10 ST (GAUZE/BANDAGES/DRESSINGS) ×2 IMPLANT
DURAPREP 26ML APPLICATOR (WOUND CARE) ×2 IMPLANT
ELECT CAUTERY BLADE 6.4 (BLADE) ×2 IMPLANT
ELECT REM PT RETURN 9FT ADLT (ELECTROSURGICAL) ×2
ELECTRODE REM PT RTRN 9FT ADLT (ELECTROSURGICAL) ×1 IMPLANT
EVACUATOR 1/8 PVC DRAIN (DRAIN) IMPLANT
FACESHIELD LNG OPTICON STERILE (SAFETY) ×2 IMPLANT
GLOVE BIO SURGEON STRL SZ7 (GLOVE) ×2 IMPLANT
GLOVE BIOGEL PI IND STRL 7.0 (GLOVE) ×1 IMPLANT
GLOVE BIOGEL PI IND STRL 7.5 (GLOVE) ×2 IMPLANT
GLOVE BIOGEL PI INDICATOR 7.0 (GLOVE) ×1
GLOVE BIOGEL PI INDICATOR 7.5 (GLOVE) ×2
GLOVE SS BIOGEL STRL SZ 7.5 (GLOVE) ×1 IMPLANT
GLOVE SUPERSENSE BIOGEL SZ 7.5 (GLOVE) ×1
GOWN PREVENTION PLUS XLARGE (GOWN DISPOSABLE) ×6 IMPLANT
GOWN STRL NON-REIN LRG LVL3 (GOWN DISPOSABLE) ×2 IMPLANT
HANDPIECE INTERPULSE COAX TIP (DISPOSABLE) ×1
HOOD PEEL AWAY FACE SHEILD DIS (HOOD) ×4 IMPLANT
IMMOBILIZER KNEE 22 UNIV (SOFTGOODS) ×2 IMPLANT
INSERT CUSHION PRONEVIEW LG (MISCELLANEOUS) ×2 IMPLANT
KIT BASIN OR (CUSTOM PROCEDURE TRAY) ×2 IMPLANT
KIT ROOM TURNOVER OR (KITS) ×2 IMPLANT
MANIFOLD NEPTUNE II (INSTRUMENTS) ×2 IMPLANT
NS IRRIG 1000ML POUR BTL (IV SOLUTION) ×2 IMPLANT
PACK TOTAL JOINT (CUSTOM PROCEDURE TRAY) ×2 IMPLANT
PAD ARMBOARD 7.5X6 YLW CONV (MISCELLANEOUS) ×4 IMPLANT
PAD CAST 4YDX4 CTTN HI CHSV (CAST SUPPLIES) ×1 IMPLANT
PADDING CAST COTTON 4X4 STRL (CAST SUPPLIES) ×1
PADDING CAST COTTON 6X4 STRL (CAST SUPPLIES) ×2 IMPLANT
POSITIONER HEAD PRONE TRACH (MISCELLANEOUS) ×2 IMPLANT
RUBBERBAND STERILE (MISCELLANEOUS) ×2 IMPLANT
SET HNDPC FAN SPRY TIP SCT (DISPOSABLE) ×1 IMPLANT
SPONGE GAUZE 4X4 12PLY (GAUZE/BANDAGES/DRESSINGS) ×2 IMPLANT
STRIP CLOSURE SKIN 1/2X4 (GAUZE/BANDAGES/DRESSINGS) ×2 IMPLANT
SUCTION FRAZIER TIP 10 FR DISP (SUCTIONS) ×2 IMPLANT
SUT ETHIBOND NAB CT1 #1 30IN (SUTURE) IMPLANT
SUT FIBERWIRE 2-0 18 17.9 3/8 (SUTURE) ×2
SUT MNCRL AB 3-0 PS2 18 (SUTURE) ×2 IMPLANT
SUT VIC AB 0 CT1 27 (SUTURE) ×5
SUT VIC AB 0 CT1 27XBRD ANBCTR (SUTURE) ×5 IMPLANT
SUT VIC AB 2-0 CT1 27 (SUTURE) ×2
SUT VIC AB 2-0 CT1 TAPERPNT 27 (SUTURE) ×2 IMPLANT
SUT VLOC 180 0 24IN GS25 (SUTURE) ×4 IMPLANT
SUTURE FIBERWR 2-0 18 17.9 3/8 (SUTURE) ×1 IMPLANT
SYR 30ML SLIP (SYRINGE) ×2 IMPLANT
TOWEL OR 17X24 6PK STRL BLUE (TOWEL DISPOSABLE) ×2 IMPLANT
TOWEL OR 17X26 10 PK STRL BLUE (TOWEL DISPOSABLE) ×2 IMPLANT
TRAY FOLEY CATH 14FR (SET/KITS/TRAYS/PACK) ×2 IMPLANT
WATER STERILE IRR 1000ML POUR (IV SOLUTION) ×6 IMPLANT

## 2011-05-02 NOTE — Anesthesia Preprocedure Evaluation (Signed)
Anesthesia Evaluation  Patient identified by MRN, date of birth, ID band Patient awake    Reviewed: Allergy & Precautions, H&P , NPO status , Patient's Chart, lab work & pertinent test results  History of Anesthesia Complications (+) PONV  Airway Mallampati: III TM Distance: >3 FB Neck ROM: Full    Dental No notable dental hx. (+) Teeth Intact   Pulmonary neg pulmonary ROS,  breath sounds clear to auscultation  Pulmonary exam normal       Cardiovascular negative cardio ROS  Rhythm:Regular Rate:Normal     Neuro/Psych PSYCHIATRIC DISORDERS  Neuromuscular disease    GI/Hepatic Neg liver ROS, hiatal hernia, GERD-  Poorly Controlled and Medicated,  Endo/Other  negative endocrine ROSMorbid obesity  Renal/GU negative Renal ROS  negative genitourinary   Musculoskeletal   Abdominal (+) + obese,   Peds  Hematology negative hematology ROS (+)   Anesthesia Other Findings   Reproductive/Obstetrics negative OB ROS                           Anesthesia Physical Anesthesia Plan  ASA: III  Anesthesia Plan: General   Post-op Pain Management:    Induction: Intravenous  Airway Management Planned: Oral ETT  Additional Equipment:   Intra-op Plan:   Post-operative Plan: Extubation in OR  Informed Consent: I have reviewed the patients History and Physical, chart, labs and discussed the procedure including the risks, benefits and alternatives for the proposed anesthesia with the patient or authorized representative who has indicated his/her understanding and acceptance.   Dental advisory given  Plan Discussed with: CRNA  Anesthesia Plan Comments:         Anesthesia Quick Evaluation

## 2011-05-02 NOTE — Anesthesia Postprocedure Evaluation (Signed)
  Anesthesia Post-op Note  Patient: Jeanne Cummings  Procedure(s) Performed: Procedure(s) (LRB): TOTAL KNEE ARTHROPLASTY (Left)  Patient Location: PACU  Anesthesia Type: General  Level of Consciousness: awake  Airway and Oxygen Therapy: Patient Spontanous Breathing  Post-op Pain: mild  Post-op Assessment: Post-op Vital signs reviewed  Post-op Vital Signs: Reviewed  Complications: No apparent anesthesia complications

## 2011-05-02 NOTE — Plan of Care (Signed)
Problem: Consults Goal: Diagnosis- Total Joint Replacement Primary Total Knee LEFT     

## 2011-05-02 NOTE — Transfer of Care (Signed)
Immediate Anesthesia Transfer of Care Note  Patient: Jeanne Cummings  Procedure(s) Performed: Procedure(s) (LRB): TOTAL KNEE ARTHROPLASTY (Left)  Patient Location: PACU  Anesthesia Type: General  Level of Consciousness: awake, sedated and patient cooperative  Airway & Oxygen Therapy: Patient Spontanous Breathing and Patient connected to nasal cannula oxygen  Post-op Assessment: Report given to PACU RN, Post -op Vital signs reviewed and stable and Patient moving all extremities  Post vital signs: Reviewed and stable  Complications: No apparent anesthesia complications

## 2011-05-02 NOTE — Progress Notes (Signed)
ANTICOAGULATION CONSULT NOTE - Initial Consult  Pharmacy Consult for Coumadin Indication: VTE prophylaxis  Allergies  Allergen Reactions  . Lidocaine   . Novacare Or   . Oxycodone-Acetaminophen   . Reglan     Vital Signs: Temp: 98.2 F (36.8 C) (04/15 1145) Temp src: Oral (04/15 0811) BP: 147/90 mmHg (04/15 1400) Pulse Rate: 97  (04/15 1400)  Labs: No results found for this basename: HGB:2,HCT:3,PLT:3,APTT:3,LABPROT:3,INR:3,HEPARINUNFRC:3,CREATININE:3,CKTOTAL:3,CKMB:3,TROPONINI:3 in the last 72 hours The CrCl is unknown because both a height and weight (above a minimum accepted value) are required for this calculation.  Medical History: Past Medical History  Diagnosis Date  . PONV (postoperative nausea and vomiting)   . DVT, lower extremity     Left leg  . Bronchitis     hx of  . Pneumonia     hx of  . Depression   . Cancer 2007    Beast cancer. Chemo & radiation  . H/O hiatal hernia   . GERD (gastroesophageal reflux disease)     Takes Zantac  . Constipation     hx of  . Neuropathy     Bilateral feet  . Arthritis   . Hyperlipidemia     No meds due to adverse affects  . Left knee DJD     Medications:  Scheduled:    .  ceFAZolin (ANCEF) IV  2 g Intravenous 60 min Pre-Op  .  ceFAZolin (ANCEF) IV  2 g Intravenous Q6H  . enoxaparin  30 mg Subcutaneous Q12H  . magnesium oxide  400 mg Oral QPM  . pantoprazole  40 mg Oral Q1200  . sorbitol  30 mL Oral BID AC  . venlafaxine XR  75 mg Oral BID  . Vitamin D (Ergocalciferol)  50,000 Units Oral Q Thu  . DISCONTD:  ceFAZolin (ANCEF) IV  1 g Intravenous 60 min Pre-Op  . DISCONTD: chlorhexidine  60 mL Topical Once  . DISCONTD: chlorhexidine  60 mL Topical Once  . DISCONTD: famotidine  10 mg Oral BID  . DISCONTD: hydrALAZINE  2 mg Intravenous Once  . DISCONTD: HYDROcodone-acetaminophen      . DISCONTD: HYDROmorphone      . DISCONTD: HYDROmorphone      . DISCONTD: povidone-iodine   Topical Once  . DISCONTD:  promethazine  6.25 mg Intravenous Once    Assessment: Jeanne Cummings s/p left total knee arthroplasty to start coumadin for VTE prophylaxis. Baseline INR 0.93 (on 4/11), patient was not taking anticoagulants prior to admission.  Goal of Therapy:  INR 2-3   Plan:  - Coumadin 5mg  po x 1 - daily PT/INR - coumadin education book and video.  Bayard Hugger, PharmD, BCPS, [phone] 05/02/2011,3:18 PM

## 2011-05-02 NOTE — Preoperative (Signed)
Beta Blockers   Reason not to administer Beta Blockers:Not Applicable 

## 2011-05-02 NOTE — Op Note (Signed)
05/02/2011  11:05 AM  PATIENT:  Jeanne Cummings  66 y.o. female  MRN: [id number]  PRE-OPERATIVE DIAGNOSIS:  LEFT KNEE ENDSTAGE DJD  POST-OPERATIVE DIAGNOSIS:  LEFT KNEE ENDSTAGE DJD  PROCEDURE:  Procedure(s): TOTAL KNEE ARTHROPLASTY   OPERATIVE REPORT     SURGEON:  Molly Maduro A. Thurston Hole, MD      ASSISTANT:  Kirstin A. Shepperson, PA-C  (Present throughout the entire procedure     and necessary for completion of procedure in a timely manner)      ANESTHESIA:  General with supplemental femoral nerve block.      COMPLICATIONS:  None.      COMPONENTS:  DePuy Sigma cemented total knee system.  A #3 femoral component.  A #4  tibial tray with a 15-mm polyethylene RP tibial spacer , a 35-mm polyethylene patella.     PROCEDURE: The patient was met in the holding area, and the appropriate knee identified and marked.  Left knee. The patient received a  preoperative femoral  nerve block by anesthesia.      The patient was then transported to OR and was placed on the operative table in a supine position. The patient was then placed under  general anesthesia without difficulty. The patient received appropriate preoperative antibiotic prophylaxis.  The patien'ts Left knee was examined under anesthesia and shown to have #minus 5-125 range of motion, varus deformity, ligamentously stable, and normal patella tracking. Nursing staff inserted a Foley catheter under sterile conditions.        Tourniquet was applied to the operative  thigh.  Leg was then prepped using sterile conditions and draped using sterile technique.  Time-out procedure was called and correct  Left knee identified.    The  leg was elevated and Esmarch exsanguinated with a thigh   tourniquet elevated ot 375 mmHg.      Initially, through a 15-cm longitudinal incision based over the patella initial exposure was made.  The underlying subcutaneous tissues were incised along with the skin incision.  A median arthrotomy was performed  revealing an excessive amount of normal-appearing joint fluid,  The articular surfaces were inspected.  The patient had grade #4 changes medially, grade #4 changes laterally, and grade #4 changes in the patellofemoral joint.  Osteophytes were removed from the femoral condyles and the tibial plateau.  The medial and lateral meniscal remnants were removed as well as the anterior cruciate ligament.    Intramedullary drill was then used to drill up the femoral canal for placement of the distal femoral cutting jig.  It was placed in the appropriate  rotation and a distal  12 mm cut was made.  The distal femur was sized.  A #3 size femoral component was found to be the appropriate size.  A #3 cutting jig was placed in an appropriate amount of external rotation and then chamfer cuts were made.   The proximal tibia was exposed.  The tibial spines were removed with an oscillating saw.  Intramedullary drill was used to drill down the tibial canal for placement of the proximal tibial cutting jig.  It ws placed in the appropriate amount of rotation and a proximal 4 mm cut was made based off the lower side.  Spacer blocks were then placed in flexion and extension.  A 15 mm block gave excellent balancing, excellent stability, and excellent correction of the patients flexion and varus deformity.  A #4 tibial baseplate trial was then placed on the cut tibial surface with a excellent fit.  The keel cut was then made.   The PCL box cutter was then placed on the distal femur and these cuts were made.  At this point, the #3 femoral trial was placed and with a #4 tibial baseplate trial and a 15-mm polyethylene RP tibial spacer.  The knee was reduced, taken through range of motion from 0-125 with excellent stability and excellent correction of the flexion and varus deformity with normal patella tracking.    A resurfacing  8.25mm cut was made on the undersurface of the patella and three locking holes placed for a  35-mm  polyethylene patella trial and again patellofemoral tracking was evaluated and found to be normal.  At this point, it was felt that all trial components were of excellent size, fit, and stability.  They were then removed.  The knee was irrigated copiously using jet lavage of 3 liters of sterile saline.    The proximal tibia was exposed.  A #4  tibial baseplate with zinacef  impregnated cement was hammered into position with a excellent fit.  Excess cement was removed from around the edges.    A #{gen number 3 femoral component with cement backing was hammered into position.  This also had excellent fit.  Excess cement was removed from around the edges.   A  35-mm polyethylene  RP tibial spacer was placed in the tibial baseplate.  The knee was reduced and taken through a full range of motion  with excellent stability and excellent correction of the flexion and varus deformities.    The 35-mm polyethylene cement backed patella was placed in its position and held there with a clamp.  After the cement had hardened, patellofemoral tracking was evaluated and found to be normal.    At this point, I felt that all components were of excellent size and fit resulting in great stability.  The wound was further irrigated with saline.  Tourniquet was deflated.  Hemostasis was obtained with cautery.  The arthrotomy was then closed with #1 V lock running suture over two medium Hemovac drains.  Subcutaneous tissues closed with 0 and 2-0 Vicryl.  The subcuticular layer was closed with a 3-0 Monocryl.  Sterile dressings were applied with a long leg knee immobilizer.  The drains were attached to an Autovac blood collection system.  The patient was awakened, extubated and taken to the recovery room in stable condition.  Needle, instrument, and sponge counts were correct times 2 at the end of the case.  Neurovascular status was normal with distal pulses 2+ and symmetric.      Tamika Nou A. Thurston Hole, MD Orthopedic  Surgeon [phone]   05/02/2011, 11:05 AM

## 2011-05-02 NOTE — Progress Notes (Deleted)
UR COMPLETED  

## 2011-05-02 NOTE — Interval H&P Note (Signed)
History and Physical Interval Note:  05/02/2011 9:11 AM  Jeanne Cummings  has presented today for surgery, with the diagnosis of LEFT KNEE DJD  The various methods of treatment have been discussed with the patient and family. After consideration of risks, benefits and other options for treatment, the patient has consented to  Procedure(s) (LRB): TOTAL KNEE ARTHROPLASTY (Left) as a surgical intervention .  The patients' history has been reviewed, patient examined, no change in status, stable for surgery.  I have reviewed the patients' chart and labs.  Questions were answered to the patient's satisfaction.     Salvatore Marvel A

## 2011-05-02 NOTE — Anesthesia Procedure Notes (Signed)
Anesthesia Regional Block:  Femoral nerve block  Pre-Anesthetic Checklist: ,, timeout performed, Correct Patient, Correct Site, Correct Laterality, Correct Procedure, Correct Position, site marked, Risks and benefits discussed, pre-op evaluation,  At surgeon's request and post-op pain management  Laterality: Left  Prep: Maximum Sterile Barrier Precautions used and chloraprep       Needles:  Injection technique: Single-shot  Needle Type: Other   (Arrow 50mm)    Needle Gauge: 22 and 22 G    Additional Needles:  Procedures: nerve stimulator Femoral nerve block  Nerve Stimulator or Paresthesia:  Response: Patellar respose, 0.42 mA,   Additional Responses:   Narrative:  Start time: 05/02/2011 8:43 AM End time: 05/02/2011 8:53 AM Injection made incrementally with aspirations every 5 mL. Anesthesiologist: Neel Buffone,MD  Additional Notes: 1.5% Carbocaine skin wheel.   Femoral nerve block

## 2011-05-02 NOTE — Progress Notes (Signed)
Orthopedic Tech Progress Note Patient Details:  Jeanne Cummings September 01, 1945 [id number]  CPM Left Knee CPM Left Knee: On Left Knee Flexion (Degrees): 90  Left Knee Extension (Degrees): 0  Additional Comments: Trapeze bar   Shawnie Pons 05/02/2011, 2:14 PM

## 2011-05-02 NOTE — H&P (View-Only) (Signed)
Jeanne Cummings is an 66 y.o. female.   Chief Complaint: left knee end stage DJD HPI: Jeanne Cummings is a 66 year old who comes in for evaluation of her left knee. She has a longstanding history of left knee degenerative joint disease. She's tried interarticular cortisone and Supartz injections. She's using a cane to ambulate due to significant pain and weakness. She requires assistance to rise from chair and has difficulty sleeping at night.  The excruciating pain is getting progressively worse, limiting activities of daily living, poses a significant fall risk, and has failed multiple conservative treatments including intraarticular cortisone injections, intraarticular Supartz, bracing, medication and home physical therapy.  Past Medical History  Diagnosis Date  . PONV (postoperative nausea and vomiting)   . DVT, lower extremity     Left leg  . Bronchitis     hx of  . Pneumonia     hx of  . Depression   . Cancer 2007    Beast cancer. Chemo & radiation  . H/O hiatal hernia   . GERD (gastroesophageal reflux disease)     Takes Zantac  . Constipation     hx of  . Neuropathy     Bilateral feet  . Arthritis   . Hyperlipidemia     No meds due to adverse affects  . Left knee DJD     Past Surgical History  Procedure Date  . Vaginal hysterectomy   . Tonsillectomy   . Joint replacement     right knee replacement  . Back surgery     Cervical surgery  . Breast lumpectomy     x 3    Family History  Problem Relation Age of Onset  . Anesthesia problems Neg Hx   . Hypotension Neg Hx   . Malignant hyperthermia Neg Hx   . Pseudochol deficiency Neg Hx   . Bone cancer Father   . Arthritis Sister   . Fibromyalgia Sister   . Interstitial cystitis Sister    Social History:  reports that she quit smoking about 31 years ago. She has never used smokeless tobacco. She reports that she drinks about 1.8 ounces of alcohol per week. She reports that she does not use illicit drugs.  Allergies:    Allergies  Allergen Reactions  . Lidocaine   . Novacare Or   . Oxycodone-Acetaminophen   . Reglan     Medications Prior to Admission  Medication Sig Dispense Refill  . Cyanocobalamin (VITAMIN B 12 PO) Inject 5 mg as directed every 30 (thirty) days.      . diclofenac-misoprostol (ARTHROTEC 75) 75-200 MG-MCG per tablet Take 1 tablet by mouth 2 (two) times daily.      . FOLIC ACID PO Take 1 tablet by mouth daily. 600 mcg      . magnesium oxide (MAG-OX) 400 MG tablet Take 400 mg by mouth every evening.      . RABEprazole (ACIPHEX) 20 MG tablet Take 20 mg by mouth every evening.      . ranitidine (ZANTAC) 150 MG tablet Take 150 mg by mouth every morning.      . venlafaxine (EFFEXOR-XR) 75 MG 24 hr capsule Take 75 mg by mouth 2 (two) times daily.      . Vitamin D, Ergocalciferol, (DRISDOL) 50000 UNITS CAPS Take 50,000 Units by mouth every 7 (seven) days. thursdays      . OVER THE COUNTER MEDICATION Take 5 tablets by mouth daily. Organic triple fiber pills.  Pt takes all 5 tabs   at once       No current facility-administered medications on file as of 04/21/2011.    No results found for this or any previous visit (from the past 48 hour(s)). No results found.  Review of Systems  Constitutional: Negative.   HENT: Negative.   Eyes: Negative.   Respiratory: Negative.   Cardiovascular: Negative.   Gastrointestinal: Positive for heartburn and constipation. Negative for nausea, vomiting, abdominal pain, diarrhea, blood in stool and melena.  Genitourinary: Negative.   Musculoskeletal: Positive for back pain and joint pain.       DDD back    Left knee pain  Skin: Negative.   Neurological: Negative.   Endo/Heme/Allergies: Positive for polydipsia. Negative for environmental allergies. Does not bruise/bleed easily.  Psychiatric/Behavioral: Negative.     Blood pressure 153/99, pulse 78, temperature 98.3 F (36.8 C), height 5' 4" (1.626 m), weight 105.235 kg (232 lb), SpO2 98.00%. Physical Exam   Constitutional: She is oriented to person, place, and time. She appears well-developed and well-nourished.  HENT:  Head: Normocephalic and atraumatic.  Mouth/Throat: Oropharynx is clear and moist.  Eyes: EOM are normal. Pupils are equal, round, and reactive to light.  Neck: Normal range of motion. Neck supple.  Cardiovascular: Normal rate and regular rhythm.   Respiratory: Effort normal and breath sounds normal.  GI: Soft. Bowel sounds are normal.  Genitourinary:       Not pertinent to current symptomatology therefore not examined.  Musculoskeletal: Normal range of motion.       Left knee has active range of motion -10 to 95 degrees 3+ crepitus 2+ synovitis medial joint line tenderness she has ipsilateral straight leg raise on the left with left leg radiculopathy she has left quad weakness compared to the right quad with 4/5 strength on the left knee right knee has 5/5 strength right knee has active range of motion 0-120 degrees well healed total knee incision 2+ dorsalis pedis pulses. She has decreased sensation in the left lower extremity compared to the right but intact to light touch.  Neurological: She is alert and oriented to person, place, and time.  Skin: Skin is warm and dry.  Psychiatric: She has a normal mood and affect. Her behavior is normal. Judgment and thought content normal.      Xray: AP,LATERAL, FLEXION, and SUNRISE views of the left knee show significant joint space narrowing, with periarticular osteophytes, and subchondral sclerosis.  Assessment Past Medical History  Diagnosis Date  . PONV (postoperative nausea and vomiting)   . DVT, lower extremity     Left leg  . Bronchitis     hx of  . Pneumonia     hx of  . Depression   . Cancer 2007    Beast cancer. Chemo & radiation  . H/O hiatal hernia   . GERD (gastroesophageal reflux disease)     Takes Zantac  . Constipation     hx of  . Neuropathy     Bilateral feet  . Arthritis   . Hyperlipidemia     No  meds due to adverse affects  . Left knee DJD      Plan At this point in time, she has significant antalgic gait ambulates with assistance of a cane has failed conservative treatment with injections and Supartz as well as home physical therapy we recommend left total knee replacement. Prior to total knee replacement she would be a excellent candidate for epidural steroid injections on the left or nerve block to control left   leg radiculopathy.   Rodneisha Bonnet J 04/21/2011, 11:27 AM    

## 2011-05-03 ENCOUNTER — Encounter (HOSPITAL_COMMUNITY): Payer: Self-pay

## 2011-05-03 LAB — BASIC METABOLIC PANEL
BUN: 18 mg/dL (ref 6–23)
Calcium: 9.1 mg/dL (ref 8.4–10.5)
Creatinine, Ser: 1.05 mg/dL (ref 0.50–1.10)
GFR calc Af Amer: 63 mL/min — ABNORMAL LOW (ref >90)

## 2011-05-03 LAB — CBC
MCH: 30.8 pg (ref 26.0–34.0)
MCHC: 34 g/dL (ref 30.0–36.0)
MCV: 90.7 fL (ref 78.0–100.0)
Platelets: 308 10*3/uL (ref 150–400)
RDW: 13.2 % (ref 11.5–15.5)
WBC: 11.2 10*3/uL — ABNORMAL HIGH (ref 4.0–10.5)

## 2011-05-03 MED ORDER — RANITIDINE HCL 150 MG/10ML PO SYRP
150.0000 mg | ORAL_SOLUTION | Freq: Two times a day (BID) | ORAL | Status: DC
  Administered 2011-05-03: 150 mg via ORAL
  Filled 2011-05-03 (×4): qty 10

## 2011-05-03 MED ORDER — BISACODYL 5 MG PO TBEC
10.0000 mg | DELAYED_RELEASE_TABLET | Freq: Once | ORAL | Status: AC
  Administered 2011-05-03: 10 mg via ORAL
  Filled 2011-05-03: qty 2

## 2011-05-03 MED ORDER — DIPHENHYDRAMINE HCL 25 MG PO CAPS
25.0000 mg | ORAL_CAPSULE | Freq: Four times a day (QID) | ORAL | Status: DC | PRN
  Administered 2011-05-03 (×2): 25 mg via ORAL
  Filled 2011-05-03 (×2): qty 1

## 2011-05-03 MED ORDER — TAPENTADOL HCL 50 MG PO TABS
50.0000 mg | ORAL_TABLET | ORAL | Status: DC | PRN
  Administered 2011-05-03 – 2011-05-05 (×10): 100 mg via ORAL
  Filled 2011-05-03 (×10): qty 2

## 2011-05-03 MED ORDER — WARFARIN SODIUM 7.5 MG PO TABS
7.5000 mg | ORAL_TABLET | Freq: Once | ORAL | Status: AC
  Administered 2011-05-03: 7.5 mg via ORAL
  Filled 2011-05-03: qty 1

## 2011-05-03 MED ORDER — POTASSIUM CHLORIDE IN NACL 20-0.9 MEQ/L-% IV SOLN
INTRAVENOUS | Status: DC
  Administered 2011-05-03 – 2011-05-04 (×2): via INTRAVENOUS
  Filled 2011-05-03 (×3): qty 1000

## 2011-05-03 MED ORDER — WARFARIN VIDEO: Freq: Once | Status: DC

## 2011-05-03 MED ORDER — DEXAMETHASONE SODIUM PHOSPHATE 10 MG/ML IJ SOLN
10.0000 mg | Freq: Once | INTRAMUSCULAR | Status: AC
  Administered 2011-05-03: 10 mg via INTRAVENOUS
  Filled 2011-05-03: qty 1

## 2011-05-03 MED ORDER — SODIUM CHLORIDE 0.9 % IV BOLUS (SEPSIS)
500.0000 mL | Freq: Once | INTRAVENOUS | Status: AC
  Administered 2011-05-03: 500 mL via INTRAVENOUS

## 2011-05-03 MED ORDER — PATIENT'S GUIDE TO USING COUMADIN BOOK
Freq: Once | Status: DC
  Filled 2011-05-03: qty 1

## 2011-05-03 NOTE — Discharge Instructions (Signed)
Home Health to be provided by [facility] [phone]

## 2011-05-03 NOTE — Progress Notes (Signed)
CARE MANAGEMENT NOTE 05/03/2011   Patient:  Jeanne Cummings, Jeanne Cummings   Account Number:  [id number]  Date Initiated:  05/03/2011  Documentation initiated by:  Vance Peper  Subjective/Objective Assessment:   66 yr old female s/p left total knee arthroplasty     Action/Plan:   Spoke with patient and her son regarding home health needs and DME.   Anticipated DC Date:  05/05/2011   Anticipated DC Plan:  HOME W HOME HEALTH SERVICES      DC Planning Services  CM consult      PAC Choice  DURABLE MEDICAL EQUIPMENT  HOME HEALTH   Choice offered to / List presented to:  C-1 Patient   DME arranged  3-N-1  [facility]  CPM      DME agency  [employer]     HH arranged  HH-2 PT  HH-1 RN  HH-3 OT      HH agency  [facility]   Status of service:  In process, will continue to follow Discharge Disposition:  HOME W HOME HEALTH SERVICES  Per UR Regulation:    If discussed at Long Length of Stay Meetings, dates discussed:    Comments:  05/03/11 1030 Vance Peper, RN BSN Nurse  Case Manager- [phone] Faxed information for home Health needs to [facility] at [facility] [phone] phone [phone]. Patient's PT/INR will be monitored by Dr. Fernande Boyden [phone].

## 2011-05-03 NOTE — Evaluation (Signed)
Occupational Therapy Evaluation Patient Details Name: Jeanne Cummings MRN: [id number] DOB: 09/11/1945 Today's Date: 05/03/2011  Problem List:  Patient Active Problem List  Diagnoses  . PONV (postoperative nausea and vomiting)  . DVT, lower extremity  . Bronchitis  . Pneumonia  . Depression  . Cancer  . H/O hiatal hernia  . GERD (gastroesophageal reflux disease)  . Neuropathy  . Arthritis  . Hyperlipidemia  . Left knee DJD    Past Medical History:  Past Medical History  Diagnosis Date  . PONV (postoperative nausea and vomiting)   . DVT, lower extremity     Left leg  . Bronchitis     hx of  . Pneumonia     hx of  . Depression   . Cancer 2007    Beast cancer. Chemo & radiation  . H/O hiatal hernia   . GERD (gastroesophageal reflux disease)     Takes Zantac  . Constipation     hx of  . Neuropathy     Bilateral feet  . Arthritis   . Hyperlipidemia     No meds due to adverse affects  . Left knee DJD    Past Surgical History:  Past Surgical History  Procedure Date  . Vaginal hysterectomy   . Tonsillectomy   . Joint replacement     right knee replacement  . Back surgery     Cervical surgery  . Breast lumpectomy     x 3  . Cholecystectomy   . Total knee arthroplasty 05/02/2011    Procedure: TOTAL KNEE ARTHROPLASTY;  Surgeon: Nilda Simmer, MD;  Location: [facility] OR;  Service: Orthopedics;  Laterality: Left;  left total knee arthroplasty    OT Assessment/Plan/Recommendation OT Assessment Clinical Impression Statement: Pt. presents s/p left TKA and with increased pain. Pt will benefit from skilled OT to increase functional level to supervision with ADLs at D/C home with husband assist PRN OT Recommendation/Assessment: Patient will need skilled OT in the acute care venue OT Problem List: Impaired balance (sitting and/or standing);Decreased safety awareness;Decreased knowledge of use of DME or AE;Decreased knowledge of precautions;Pain Barriers to Discharge: None OT  Therapy Diagnosis : Acute pain OT Plan OT Frequency: Min 2X/week OT Treatment/Interventions: Self-care/ADL training;DME and/or AE instruction;Patient/family education;Balance training OT Recommendation Follow Up Recommendations: No OT follow up;Supervision - Intermittent Equipment Recommended: None recommended by OT Individuals Consulted Consulted and Agree with Results and Recommendations: Patient OT Goals Acute Rehab OT Goals OT Goal Formulation: With patient Time For Goal Achievement: 7 days ADL Goals Pt Will Perform Grooming: with set-up;with supervision;Standing at sink ADL Goal: Grooming - Progress: Goal set today Pt Will Perform Lower Body Bathing: with set-up;with supervision;Sit to stand from chair;with adaptive equipment ADL Goal: Lower Body Bathing - Progress: Goal set today Pt Will Perform Lower Body Dressing: with set-up;with supervision;Sit to stand from chair;with adaptive equipment ADL Goal: Lower Body Dressing - Progress: Goal set today Pt Will Transfer to Toilet: with supervision;with DME;Ambulation;3-in-1 ADL Goal: Toilet Transfer - Progress: Goal set today Pt Will Perform Tub/Shower Transfer: Shower transfer;with min assist;with DME;Ambulation;Shower seat with back ADL Goal: Web designer - Progress: Goal set today  OT Evaluation Precautions/Restrictions  Precautions Precautions: Knee Restrictions Weight Bearing Restrictions: Yes LLE Weight Bearing: Weight bearing as tolerated Prior Functioning Home Living Lives With: Spouse Available Help at Discharge: Family;Other (Comment);Available 24 hours/day (husband) Type of Home: House Home Access: Stairs to enter [employer] of Steps: 3 Entrance Stairs-Rails: Right Home Layout: Multi-level;Other (Comment) (3 levels bedroom  is on 3rd floor) Alternate Level Stairs-Number of Steps: 13 Alternate Level Stairs-Rails: Right Bathroom Shower/Tub: Health visitor: Handicapped  height Bathroom Accessibility: Yes How Accessible: Accessible via walker Home Adaptive Equipment: Bedside commode/3-in-1;Walker - rolling;Straight cane;Reacher;Sock aid;Long-handled sponge Prior Function Level of Independence: Independent with assistive device(s) Able to Take Stairs?: Yes Driving: Yes Vocation: Volunteer work Comments: Pt. works a lot in the [employer]  ADL ADL Eating/Feeding: Simulated;Independent Where Assessed - Eating/Feeding: Chair Grooming: Performed;Wash/dry hands;Wash/dry face;Teeth care;Set up;Minimal assistance Grooming Details (indicate cue type and reason): Min assist for balance at sink and min verbal cues fro hand placement Where Assessed - Grooming: Standing at sink Upper Body Bathing: Simulated;Chest;Right arm;Left arm;Abdomen;Set up Where Assessed - Upper Body Bathing: Sitting, chair Lower Body Bathing: Simulated;Moderate assistance Where Assessed - Lower Body Bathing: Sit to stand from chair Upper Body Dressing: Performed;Minimal assistance Upper Body Dressing Details (indicate cue type and reason): with donning gown Where Assessed - Upper Body Dressing: Sitting, chair Lower Body Dressing: Simulated;Moderate assistance Where Assessed - Lower Body Dressing: Sit to stand from chair Toilet Transfer: Simulated;Minimal assistance Toilet Transfer Details (indicate cue type and reason): Mod verbal cues for hand placement and technique Toilet Transfer Method: Ambulating Toilet Transfer Equipment: Other (comment) (recliner) Toileting - Clothing Manipulation: Performed;Maximal assistance Toileting - Clothing Manipulation Details (indicate cue type and reason): With pulling up undergarments due to pt. relying on bil UE support Where Assessed - Toileting Clothing Manipulation: Sit to stand from 3-in-1 or toilet Toileting - Hygiene: Performed;Minimal assistance Where Assessed - Toileting Hygiene: Sit on 3-in-1 or toilet Equipment Used: Rolling walker Ambulation  Related to ADLs: Pt. min assist ~20' with RW and min verbal cues for technique and sequencing ADL Comments: Pt. educated on use of AE for completing LB ADLs to increase functional indepndence with ADLs    Cognition Cognition Orientation Level: Oriented X4    Extremity Assessment RUE Assessment RUE Assessment: Within Functional Limits LUE Assessment LUE Assessment: Within Functional Limits Mobility  Bed Mobility Bed Mobility: Yes Supine to Sit: 3: Mod assist;With rails Supine to Sit Details (indicate cue type and reason): Assist for left LE scooting and min verbal cues for hand placement on rail Transfers Transfers: Yes Sit to Stand: 4: Min assist;From chair/3-in-1;With upper extremity assist;From bed Sit to Stand Details (indicate cue type and reason): Min verbal cues for hand placement and technique Stand to Sit: 4: Min assist;To chair/3-in-1 Stand to Sit Details: Min verbal cues for controlled descent    End of Session OT - End of Session Equipment Utilized During Treatment: Gait belt;Left knee immobilizer Activity Tolerance: Patient tolerated treatment well Patient left: in chair;with call bell in reach Nurse Communication: Mobility status for transfers General Behavior During Session: [facility] for tasks performed Cognition: [facility] for tasks performed   Jeanne Cummings, OTR/L Pager [phone] 05/03/2011, 11:57 AM

## 2011-05-03 NOTE — Progress Notes (Signed)
Patient ID: Jeanne Cummings, female   DOB: [birth year], 66 y.o.   MRN: [id number] PATIENT ID: Jeanne Cummings  MRN: [id number]  DOB/AGE:  [birth year] / 66 y.o.  1 Day Post-Op Procedure(s) (LRB): TOTAL KNEE ARTHROPLASTY (Left)    PROGRESS NOTE Subjective: Patient is alert, oriented, no Nausea, no Vomiting, yes} passing gas, yes Bowel Movement. Taking PO well. Denies SOB, Chest or Calf Pain. Using Incentive Spirometer, PAS in place. Ambulate not yet, CPM 0-90 Patient reports pain as 8 on 0-10 scale  .    Objective: Vital signs in last 24 hours: Filed Vitals:   05/02/11 2055 05/02/11 2330 05/03/11 0343 05/03/11 0647  BP: 142/76 126/84 137/74 143/84  Pulse: 92 92 92 87  Temp: 97.5 F (36.4 C)  97.8 F (36.6 C) 98 F (36.7 C)  TempSrc: Oral   Axillary  Resp: 15 20 18 18   Height:      Weight:      SpO2: 96%  99% 97%      Intake/Output from previous day: I/O last 3 completed shifts: In: 1850 [P.O.:800; I.V.:1000; IV Piggyback:50] Out: 1650 [Urine:1250; Drains:350; Blood:50]   Intake/Output this shift:     LABORATORY DATA:  Basename 05/03/11 0550  WBC --  HGB --  HCT --  PLT --  NA --  K --  CL --  CO2 --  BUN --  CREATININE --  GLUCOSE --  GLUCAP --  INR 1.05  CALCIUM --    Examination: ABD soft Neurovascular intact Sensation intact distally Intact pulses distally Dorsiflexion/Plantar flexion intact Incision: dressing C/D/I}  Assessment:   1 Day Post-Op Procedure(s) (LRB): TOTAL KNEE ARTHROPLASTY (Left) ADDITIONAL DIAGNOSIS:  Acute Blood Loss Anemia and Hypertension  Plan: PT/OT WBAT, CPM 5/hrs day until ROM 0-90 degrees, then D/C CPM DVT Prophylaxis:  SCDx72hr\Coumadin for 2 weeks target INR 1.5-2.0 DISCHARGE PLAN: Home DISCHARGE NEEDS: HHPT, HHRN, CPM, Walker and 3-in-1 comode seat     Tyric Rodeheaver J 05/03/2011, 8:19 AM

## 2011-05-03 NOTE — Progress Notes (Signed)
UR COMPLETED  

## 2011-05-03 NOTE — Progress Notes (Signed)
Pt c/o indigestion and requested for something to help relieve her pain. On call Dr. Barry Dienes notified and new order received to administer Zantac 150mg  BID with first dose tonight. Pharmacy was notified of the medication and was waiting on the medicine to come in but Pt spouse and daughter come and informed me (nurse) that pt was "hurting so bad and want to come clean" they've given pt her Zantac and Aciphex from home which pt has taken and in bed. Will continue to monitor pt.

## 2011-05-03 NOTE — Progress Notes (Addendum)
ANTICOAGULATION CONSULT NOTE -   Pharmacy Consult for Coumadin Indication: VTE prophylaxis  Allergies  Allergen Reactions  . Lidocaine   . Novacare Or   . Oxycodone-Acetaminophen   . Reglan     Vital Signs: Temp: 98 F (36.7 C) (04/16 0647) Temp src: Axillary (04/16 0647) BP: 143/84 mmHg (04/16 0647) Pulse Rate: 87  (04/16 0647)  Labs:  Basename 05/03/11 0550  HGB --  HCT --  PLT --  APTT --  LABPROT 13.9  INR 1.05  HEPARINUNFRC --  CREATININE --  CKTOTAL --  CKMB --  TROPONINI --   Estimated Creatinine Clearance: 61 ml/min (by C-G formula based on Cr of 1.03).  Medical History: Past Medical History  Diagnosis Date  . PONV (postoperative nausea and vomiting)   . DVT, lower extremity     Left leg  . Bronchitis     hx of  . Pneumonia     hx of  . Depression   . Cancer 2007    Beast cancer. Chemo & radiation  . H/O hiatal hernia   . GERD (gastroesophageal reflux disease)     Takes Zantac  . Constipation     hx of  . Neuropathy     Bilateral feet  . Arthritis   . Hyperlipidemia     No meds due to adverse affects  . Left knee DJD      Assessment: 66 YOF s/p left total knee arthroplasty started coumadin for VTE prophylaxis on 05/02/11. She has hx of DVT. Discussed with  PA.   Plan is 1 month of coumadin with INR goal 2-3.  Coumadin score = 3 Goal of Therapy: INR 2-3  Plan:  - Coumadin 7.5mg  po x 1 - daily PT/INR - coumadin book and video not charted last night, will re-order for today -pt with hx of DVT, also has SCDs on -on LMWH 30 mg sq q12h Herby Abraham, Pharm.D. [phone] 05/03/2011 9:26 AM

## 2011-05-03 NOTE — Progress Notes (Signed)
Physical Therapy Evaluation Patient Details Name: Jeanne Cummings MRN: [id number] DOB: April 26, 1945 Today's Date: 05/03/2011  Problem List:  Patient Active Problem List  Diagnoses  . PONV (postoperative nausea and vomiting)  . DVT, lower extremity  . Bronchitis  . Pneumonia  . Depression  . Cancer  . H/O hiatal hernia  . GERD (gastroesophageal reflux disease)  . Neuropathy  . Arthritis  . Hyperlipidemia  . Left knee DJD    Past Medical History:  Past Medical History  Diagnosis Date  . PONV (postoperative nausea and vomiting)   . DVT, lower extremity     Left leg  . Bronchitis     hx of  . Pneumonia     hx of  . Depression   . Cancer 2007    Beast cancer. Chemo & radiation  . H/O hiatal hernia   . GERD (gastroesophageal reflux disease)     Takes Zantac  . Constipation     hx of  . Neuropathy     Bilateral feet  . Arthritis   . Hyperlipidemia     No meds due to adverse affects  . Left knee DJD    Past Surgical History:  Past Surgical History  Procedure Date  . Vaginal hysterectomy   . Tonsillectomy   . Joint replacement     right knee replacement  . Back surgery     Cervical surgery  . Breast lumpectomy     x 3  . Cholecystectomy   . Total knee arthroplasty 05/02/2011    Procedure: TOTAL KNEE ARTHROPLASTY;  Surgeon: Nilda Simmer, MD;  Location: [facility] OR;  Service: Orthopedics;  Laterality: Left;  left total knee arthroplasty    PT Assessment/Plan/Recommendation PT Assessment Clinical Impression Statement: Pt is a 66 y/o female admitted s/p left TKA along with the below PT problem list.  Pt would benefit from acute PT to maximize independence and facilitate d/c home with HHPT. PT Recommendation/Assessment: Patient will need skilled PT in the acute care venue PT Problem List: Decreased strength;Decreased range of motion;Decreased activity tolerance;Decreased balance;Decreased mobility;Decreased knowledge of use of DME;Decreased knowledge of  precautions;Pain Barriers to Discharge: None PT Therapy Diagnosis : Difficulty walking;Acute pain PT Plan PT Frequency: 7X/week PT Treatment/Interventions: DME instruction;Gait training;Stair training;Functional mobility training;Therapeutic activities;Therapeutic exercise;Balance training;Patient/family education PT Recommendation Follow Up Recommendations: Home health PT Equipment Recommended: None recommended by PT PT Goals  Acute Rehab PT Goals PT Goal Formulation: With patient Time For Goal Achievement: 7 days Pt will go Supine/Side to Sit: with modified independence PT Goal: Supine/Side to Sit - Progress: Goal set today Pt will go Sit to Supine/Side: with modified independence PT Goal: Sit to Supine/Side - Progress: Goal set today Pt will go Sit to Stand: with modified independence PT Goal: Sit to Stand - Progress: Goal set today Pt will go Stand to Sit: with modified independence PT Goal: Stand to Sit - Progress: Goal set today Pt will Ambulate: >150 feet;with modified independence;with least restrictive assistive device PT Goal: Ambulate - Progress: Goal set today Pt will Go Up / Down Stairs: Flight;with min assist;with least restrictive assistive device PT Goal: Up/Down Stairs - Progress: Goal set today Pt will Perform Home Exercise Program: Independently PT Goal: Perform Home Exercise Program - Progress: Goal set today  PT Evaluation Precautions/Restrictions  Precautions Precautions: Knee Precaution Booklet Issued: No Required Braces or Orthoses: Knee Immobilizer - Left Knee Immobilizer - Left: On when out of bed or walking;Discontinue post op day 2 Restrictions Weight Bearing  Restrictions: Yes LLE Weight Bearing: Weight bearing as tolerated Prior Functioning  Home Living Lives With: Spouse Available Help at Discharge: Family;Other (Comment);Available 24 hours/day (husband) Type of Home: House Home Access: Stairs to enter [employer] of Steps:  3 Entrance Stairs-Rails: Right Home Layout: Multi-level;Other (Comment) (3 levels bedroom is on 3rd floor) Alternate Level Stairs-Number of Steps: 13 Alternate Level Stairs-Rails: Right Bathroom Shower/Tub: Health visitor: Handicapped height Bathroom Accessibility: Yes How Accessible: Accessible via walker Home Adaptive Equipment: Bedside commode/3-in-1;Walker - rolling;Straight cane;Reacher;Sock aid;Long-handled sponge Prior Function Level of Independence: Independent with assistive device(s) Able to Take Stairs?: Yes Driving: Yes 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 Arousal/Alertness: Awake/alert Overall Cognitive Status: Appears within functional limits for tasks assessed Orientation Level: Oriented X4 Sensation/Coordination Sensation Light Touch: Appears Intact Stereognosis: Not tested Hot/Cold: Not tested Proprioception: Not tested Coordination Gross Motor Movements are Fluid and Coordinated: Yes Fine Motor Movements are Fluid and Coordinated: Yes Extremity Assessment RUE Assessment RUE Assessment: Within Functional Limits LUE Assessment LUE Assessment: Within Functional Limits RLE Assessment RLE Assessment: Within Functional Limits LLE Assessment LLE Assessment: Exceptions to WFL LLE PROM (degrees) Left Knee Extension 0-130: 0 Left Knee Flexion 0-140: 85 LLE Strength LLE Overall Strength: Deficits;Due to pain LLE Overall Strength Comments: 2/5 Pain 8/10 with session in left knee.  Pt premedicated and repositioned. Mobility (including Balance) Bed Mobility Bed Mobility: Yes Supine to Sit: 3: Mod assist;With rails;HOB flat Supine to Sit Details (indicate cue type and reason): Assist for left LE and trunk due to pain with cues for sequence. Transfers Transfers: Yes Sit to Stand: 4: Min assist;With upper extremity assist;From bed Sit to Stand Details (indicate cue type and reason): Assist for balance with cues for hand placement. Stand  to Sit: 4: Min assist;To chair/3-in-1;With upper extremity assist Stand to Sit Details: Assist to slow descent with cues for hand/left LE placement. Ambulation/Gait Ambulation/Gait: Yes Ambulation/Gait Assistance: 4: Min assist Ambulation/Gait Assistance Details (indicate cue type and reason): Assist for balance as well as to off weight left LE due to pain with cues for sequence. Ambulation Distance (Feet): 40 Feet Assistive device: Rolling walker Gait Pattern: Step-to pattern;Decreased step length - left;Decreased stance time - left Stairs: No Wheelchair Mobility Wheelchair Mobility: No  Posture/Postural Control Posture/Postural Control: No significant limitations Balance Balance Assessed: No Exercise  Total Joint Exercises Ankle Circles/Pumps: AROM;Left;10 reps;Supine Quad Sets: AROM;Left;10 reps;Supine Heel Slides: AAROM;Left;10 reps;Supine End of Session PT - End of Session Equipment Utilized During Treatment: Gait belt;Left knee immobilizer Activity Tolerance: Patient tolerated treatment well Patient left: in chair;with call bell in reach (On 3-N-1 with RN aware.) Nurse Communication: Mobility status for transfers;Mobility status for ambulation General Behavior During Session: [facility] for tasks performed Cognition: [facility] for tasks performed  Cephus Shelling 05/03/2011, 12:23 PM  05/03/2011 Cephus Shelling, PT, DPT [phone]

## 2011-05-03 NOTE — Progress Notes (Signed)
PT Treatment Note:   05/03/11 1538  PT Visit Information  Last PT Received On 05/03/11  Precautions  Precautions Knee  Precaution Booklet Issued No  Required Braces or Orthoses Knee Immobilizer - Left  Knee Immobilizer - Left On when out of bed or walking;Discontinue post op day 2  Restrictions  Weight Bearing Restrictions Yes  LLE Weight Bearing WBAT  Bed Mobility  Bed Mobility Yes  Supine to Sit 4: Min assist;HOB flat  Supine to Sit Details (indicate cue type and reason) Assist for right LE with cues for sequence.  Transfers  Transfers Yes  Sit to Stand 4: Min assist;With upper extremity assist;From bed;From chair/3-in-1 (2 trials.)  Sit to Stand Details (indicate cue type and reason) Assist for balance with cues for hand placement.  Stand to Sit 4: Min assist;With upper extremity assist;To chair/3-in-1 (2 trials.)  Stand to Sit Details Assist to slow descent to chair with cues for hand/right LE placement.  Ambulation/Gait  Ambulation/Gait Yes  Ambulation/Gait Assistance 4: Min assist  Ambulation/Gait Assistance Details (indicate cue type and reason) Assist for balance with cues for sequence.  Ambulation Distance (Feet) 70 Feet (15 feet and then 55 feet.)  Assistive device Rolling walker  Gait Pattern Step-to pattern;Decreased step length - left;Decreased stance time - left  Stairs No  Wheelchair Mobility  Wheelchair Mobility No  Posture/Postural Control  Posture/Postural Control No significant limitations  Balance  Balance Assessed No  PT - End of Session  Equipment Utilized During Treatment Gait belt;Left knee immobilizer  Activity Tolerance Patient tolerated treatment well  Patient left in chair;with call bell in reach  Nurse Communication Mobility status for transfers;Mobility status for ambulation  General  Behavior During Session [facility] for tasks performed  Cognition [facility] for tasks performed  PT - Assessment/Plan  Comments on Treatment Session Pt admitted s/p  right TKA and is progressing.  Pt able to tolerate ambulation BID today with increased independence and distance in the pm.  PT Plan Discharge plan remains appropriate;Frequency remains appropriate  PT Frequency 7X/week  Follow Up Recommendations Home health PT  Equipment Recommended None recommended by PT  Acute Rehab PT Goals  PT Goal Formulation With patient  Time For Goal Achievement 7 days  PT Goal: Supine/Side to Sit - Progress Progressing toward goal  PT Goal: Sit to Stand - Progress Progressing toward goal  PT Goal: Stand to Sit - Progress Progressing toward goal  PT Goal: Ambulate - Progress Progressing toward goal    Pain 5/10 with session in right knee.  Pt repositioned and premedicated.  05/03/2011 Cephus Shelling, PT, DPT [phone]

## 2011-05-04 LAB — BASIC METABOLIC PANEL
Chloride: 100 mEq/L (ref 96–112)
Creatinine, Ser: 0.78 mg/dL (ref 0.50–1.10)
GFR calc Af Amer: >90 mL/min (ref >90)
GFR calc non Af Amer: 85 mL/min — ABNORMAL LOW (ref >90)
Potassium: 4.4 mEq/L (ref 3.5–5.1)

## 2011-05-04 LAB — CBC
MCHC: 33.7 g/dL (ref 30.0–36.0)
Platelets: 228 10*3/uL (ref 150–400)
RDW: 13.5 % (ref 11.5–15.5)
WBC: 13.5 10*3/uL — ABNORMAL HIGH (ref 4.0–10.5)

## 2011-05-04 LAB — PROTIME-INR: Prothrombin Time: 18.7 seconds — ABNORMAL HIGH (ref 11.6–15.2)

## 2011-05-04 MED ORDER — DEXAMETHASONE SODIUM PHOSPHATE 10 MG/ML IJ SOLN
10.0000 mg | Freq: Every day | INTRAMUSCULAR | Status: DC
  Administered 2011-05-04: 10 mg via INTRAVENOUS
  Filled 2011-05-04 (×2): qty 1

## 2011-05-04 MED ORDER — PANTOPRAZOLE SODIUM 40 MG PO TBEC
80.0000 mg | DELAYED_RELEASE_TABLET | Freq: Every day | ORAL | Status: DC
  Administered 2011-05-04: 80 mg via ORAL
  Filled 2011-05-04: qty 2

## 2011-05-04 MED ORDER — MAGNESIUM CITRATE PO SOLN
0.5000 | Freq: Once | ORAL | Status: DC
  Filled 2011-05-04: qty 296

## 2011-05-04 MED ORDER — FAMOTIDINE 40 MG PO TABS
40.0000 mg | ORAL_TABLET | Freq: Two times a day (BID) | ORAL | Status: DC
  Administered 2011-05-04 – 2011-05-05 (×3): 40 mg via ORAL
  Filled 2011-05-04 (×4): qty 1

## 2011-05-04 MED ORDER — SUCRALFATE 1 GM/10ML PO SUSP
1.0000 g | Freq: Three times a day (TID) | ORAL | Status: DC
  Administered 2011-05-04 – 2011-05-05 (×5): 1 g via ORAL
  Filled 2011-05-04 (×9): qty 10

## 2011-05-04 MED ORDER — RANITIDINE HCL 150 MG/10ML PO SYRP: 300.0000 mg | ORAL_SOLUTION | Freq: Two times a day (BID) | ORAL | Status: DC

## 2011-05-04 MED ORDER — BISACODYL 10 MG RE SUPP
10.0000 mg | Freq: Once | RECTAL | Status: AC
  Administered 2011-05-04: 10 mg via RECTAL

## 2011-05-04 MED ORDER — WARFARIN SODIUM 5 MG PO TABS
5.0000 mg | ORAL_TABLET | Freq: Once | ORAL | Status: AC
  Administered 2011-05-04: 5 mg via ORAL
  Filled 2011-05-04: qty 1

## 2011-05-04 NOTE — Evaluation (Signed)
Clinical/Bedside Swallow Evaluation Patient Details  Name: Jeanne Cummings MRN: [id number] DOB: 03-14-1945 Today's Date: 05/04/2011  HPI:  66 yr old admitted for left knee surgery 05/02/11 now experiening a sensation of burning and "a bubble" in throat, difficulty swallowing solid consistencies and own saliva at times.  Pt. has history of significant reflux and takes meds at home.  ENT consult this am reported he feels problems are due to reflux irritated/increased with recent surgery.  Pt. also reported waking up at night since surgery with difficulty breathing which ENT feels may be laryngospasms due to reflux.  Past Medical History:  Past Medical History  Diagnosis Date  . PONV (postoperative nausea and vomiting)   . DVT, lower extremity     Left leg  . Bronchitis     hx of  . Pneumonia     hx of  . Depression   . Cancer 2007    Beast cancer. Chemo & radiation  . H/O hiatal hernia   . GERD (gastroesophageal reflux disease)     Takes Zantac  . Constipation     hx of  . Neuropathy     Bilateral feet  . Arthritis   . Hyperlipidemia     No meds due to adverse affects  . Left knee DJD    Past Surgical History:  Past Surgical History  Procedure Date  . Vaginal hysterectomy   . Tonsillectomy   . Joint replacement     right knee replacement  . Back surgery     Cervical surgery  . Breast lumpectomy     x 3  . Cholecystectomy   . Total knee arthroplasty 05/02/2011    Procedure: TOTAL KNEE ARTHROPLASTY;  Surgeon: Nilda Simmer, MD;  Location: [facility] OR;  Service: Orthopedics;  Laterality: Left;  left total knee arthroplasty    Assessment/Recommendations/Treatment Plan Suspected Esophageal Findings Suspected Esophageal Findings: Belching (x 2)  SLP Assessment Clinical Impression Statement: Pt. appears to have pharyngeal/esophageal irritation from surgery and short term intubation leading to exacerbation of pt.'s GERD per pt. symptom report and belching during assessment.  No  pharyngeal dysphagia is susptected.  Discussed clinical findings with pt. and family and provided compensatory strategies such as drinking warm liquids, no straws to decrease inhaled air (pt. had been using straws), stay sitting up after meals.  Pt.'s symptoms should resolve in several days.  Reccommend she continue a clear liquid diet and attempt semi-solid foods as her symptoms decrease.  ST will follow along.    Risk for Aspiration: Mild Other Related Risk Factors: History of GERD  Swallow Evaluation Recommendations Diet Recommendations: Thin liquid (liquid diet) Liquid Administration via: Cup;No straw Medication Administration: Crushed with puree Supervision: Patient able to self feed Compensations: Slow rate (warm lliquids, no straws) Postural Changes and/or Swallow Maneuvers: Upright 30-60 min after meal;Seated upright 90 degrees Oral Care Recommendations: Oral care BID Follow up Recommendations: None  Treatment Plan Treatment Plan Recommendations: Therapy as outlined in treatment plan below Speech Therapy Frequency: min 1 x/week Treatment Duration: 2 weeks Interventions: Patient/family education;Trials of upgraded texture/liquids;Diet toleration management by SLP;Compensatory techniques (reflux precautions)  Prognosis Prognosis for Safe Diet Advancement: Good  Individuals Consulted Consulted and Agree with Results and Recommendations: Patient;Family member/caregiver  Swallowing Goals  SLP Swallowing Goals Patient will consume recommended diet without observed clinical signs of aspiration with: Independent assistance Patient will utilize recommended strategies during swallow to increase swallowing safety with: Independent assistance  Swallow Study  General  Date of Onset: 05/03/11 HPI:  66 yr old admitted for left knee surgery 05/02/11 now experiening a sensation of burning and "a bubble" in throat, difficulty swallowing solid consistencies and own saliva at times.  Pt. has  history of significant reflux and takes meds at home.  ENT consult this am reported he feels problems are due to reflux irritated/increased with recent surgery.  Pt. also reported waking up at night since surgery with difficulty breathing which ENT feels may be laryngospasms due to reflux. Type of Study: Bedside swallow evaluation Diet Prior to this Study: Thin liquids (clears) Respiratory Status: Room air History of Intubation: Yes Length of Intubations (days):  (hours day of surgery only) Date extubated: 05/02/11 Behavior/Cognition: Alert;Cooperative;Pleasant mood Oral Cavity - Dentition: Adequate natural dentition Vision: Functional for self-feeding Patient Positioning: Upright in chair Baseline Vocal Quality: Clear Volitional Cough: Strong Volitional Swallow: Able to elicit  Oral Motor/Sensory Function  Overall Oral Motor/Sensory Function: Appears within functional limits for tasks assessed  Consistency Results  Ice Chips Ice chips: Not tested  Thin Liquid Thin Liquid: Within functional limits Presentation: Cup  Nectar Thick Liquid Nectar Thick Liquid: Not tested  Honey Thick Liquid Honey Thick Liquid: Not tested  Puree Puree: Not tested (Not given applesauce due to pt. c/o irritation w/ acid)  Solid Solid: Not tested (due to pain/inability swallowing chicken last night)   Royce Macadamia M.Ed CCC-SLP Cell [phone] for 4/17  05/04/2011

## 2011-05-04 NOTE — Consult Note (Signed)
Asked to see the patient for sore throat with eating.  66 year old who underwent a total knee operation and now has difficulty swallowing liquids and solids secondary to a intense burning sensation in her throat when she does these activities. When she is not drinking or eating she seems to be fine with no pain. She has not had an upper respiratory infection prior to the surgery. She has significant reflux issues and has been on Zantac and a proton pump inhibitor for a long time. She has no cough. She does have a sensation of not being able to breathe at night and wakes herself up feeling this sensation. This rapidly resolves once she is awake. No change in her voice. No ear pain.  Alert and awake, her voice is normal. Face: No evidence of lesions or swelling. Facial nerve is intact. Eyes: Pupils equally round and reactive to light extraocular motor muscles are intact Ears: No evidence of any lesions Nose: No swelling or drainage. Septum seems to be midline. The external nose has no deformity or inflammation. Oral cavity/oropharynx: No evidence of any erythema or lesions. The uvula looks normal. Tongue looks normal. Neck: No swelling, masses, or lesions. Soft and good range of motion Neurologic: No evidence of focal lesions or abnormalities.  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 It sounds like she may be having laryngospasms at night with her waking up feeling short of breath  which is also almost always related to reflux. If this persists or is happening during the day however she needs to have a medical workup for pulmonary issues. The treatment for now is aggressive reflux treatment with twice a day proton pump inhibitor like Prilosec 20 mg twice a day and Zantac at night 300 mg before bed. This problem hopefully will resolve rapidly over the next 24-48 hours. She does seem to be able to swallow  her own saliva so he does not appear to be a actual obstruction swelling type issue but rather a mucosal irritation problem.

## 2011-05-04 NOTE — Progress Notes (Signed)
Orthopedic Tech Progress Note Patient Details:  Jeanne Cummings 11/18/1945 [id number]  Patient ID: Jeanne Cummings, female   DOB: 05/31/1945, 66 y.o.   MRN: [id number]   Jeanne Cummings 05/04/2011, 2:17 AM Pt has knee immobilizer

## 2011-05-04 NOTE — Progress Notes (Signed)
Occupational Therapy Treatment Patient Details Name: Jeanne Cummings MRN: [id number] DOB: [birth year] Today's Date: 05/04/2011  OT Assessment/Plan OT Assessment/Plan Comments on Treatment Session: Pt. progressing well with therapy session OT Plan: Discharge plan remains appropriate OT Frequency: Min 2X/week Follow Up Recommendations: No OT follow up;Supervision - Intermittent Equipment Recommended: None recommended by OT OT Goals Acute Rehab OT Goals OT Goal Formulation: With patient Time For Goal Achievement: 7 days ADL Goals Pt Will Perform Lower Body Bathing: with set-up;with supervision;Sit to stand from chair;with adaptive equipment ADL Goal: Lower Body Bathing - Progress: Met Pt Will Perform Lower Body Dressing: with set-up;with supervision;Sit to stand from chair;with adaptive equipment ADL Goal: Lower Body Dressing - Progress: Met Pt Will Transfer to Toilet: with supervision;with DME;Ambulation;3-in-1 ADL Goal: Toilet Transfer - Progress: Met Pt Will Perform Tub/Shower Transfer: Shower transfer;with min assist;with DME;Ambulation;Shower seat with back ADL Goal: Web designer - Progress: Met  OT Treatment Precautions/Restrictions  Precautions Precautions: Knee Precaution Booklet Issued: No Required Braces or Orthoses: Knee Immobilizer - Left Knee Immobilizer - Left: On when out of bed or walking;Discontinue post op day 2 Restrictions Weight Bearing Restrictions: Yes LLE Weight Bearing: Weight bearing as tolerated   ADL ADL Lower Body Bathing: Simulated;Set up Lower Body Bathing Details (indicate cue type and reason): with long handled sponge Where Assessed - Lower Body Bathing: Sitting, chair Lower Body Dressing: Performed;Set up Lower Body Dressing Details (indicate cue type and reason): With reacher and sock aid for don/doff socks and under garments Where Assessed - Lower Body Dressing: Sit to stand from chair Toilet Transfer: Performed;Other (comment) (min  guard assist) Toilet Transfer Details (indicate cue type and reason): Min verbal cues for hand placement and technique Toilet Transfer Method: Ambulating Toilet Transfer Equipment: Raised toilet seat with arms (or 3-in-1 over toilet) Toileting - Clothing Manipulation: Performed;Set up Where Assessed - Toileting Clothing Manipulation: Sit on 3-in-1 or toilet Toileting - Hygiene: Performed;Set up Where Assessed - Toileting Hygiene: Sit to stand from 3-in-1 or toilet Tub/Shower Transfer: Simulated;Minimal assistance Tub/Shower Transfer Details (indicate cue type and reason): With posterior entrance and use of RW for support and stepping into shower with non-operated leg first Tub/Shower Transfer Method: Ambulating Tub/Shower Transfer Equipment: Shower seat with back;Walk in shower Equipment Used: Rolling walker ADL Comments: Pt. educated on use of AE for completing LB ADLs to increase functional indepndence with ADLs Mobility  Bed Mobility Bed Mobility: No Transfers Sit to Stand: 4: Min assist;With upper extremity assist;From bed;From chair/3-in-1     End of Session OT - End of Session Equipment Utilized During Treatment: Gait belt;Left knee immobilizer Activity Tolerance: Patient tolerated treatment well Patient left: in chair;with call bell in reach Nurse Communication: Mobility status for transfers General Behavior During Session: [facility] for tasks performed Cognition: [facility] for tasks performed  Bryna Razavi, OTR/L Pager [phone]  05/04/2011, 10:35 AM

## 2011-05-04 NOTE — Progress Notes (Signed)
PT Progress Note:      05/04/11 1100  PT Visit Information  Last PT Received On 05/04/11  Precautions  Precautions Knee  Precaution Booklet Issued No  Required Braces or Orthoses Knee Immobilizer - Left  Knee Immobilizer - Left On when out of bed or walking;Discontinue post op day 2  Restrictions  Weight Bearing Restrictions Yes  LLE Weight Bearing WBAT  Bed Mobility  Bed Mobility No  Transfers  Transfers Yes  Sit to Stand 4: Min assist;From chair/3-in-1;With upper extremity assist;With armrests (from ortho gym mat table)  Sit to Stand Details (indicate cue type and reason) (A) for balance & safety.    Stand to Sit 5: Supervision;To chair/3-in-1;With armrests;With upper extremity assist (to ortho gym mat table)  Stand to Sit Details cues for hand placement   Ambulation/Gait  Ambulation/Gait Yes  Ambulation/Gait Assistance (Min Guard (A))  Ambulation/Gait Assistance Details (indicate cue type and reason) Guarding for safety.  Cues for upright posture, encouragement to decrease weight through UE"s if tolerable.  Did not use KI today due to pt performing SLR's without (A)- no knee buckling noted.      Ambulation Distance (Feet) 80 Feet (2x's. )  Assistive device Rolling walker  Gait Pattern Step-to pattern;Decreased step length - right;Decreased stance time - left;Decreased hip/knee flexion - left  Stairs Yes  Stairs Assistance 3: Mod assist  Stairs Assistance Details (indicate cue type and reason) (A) for balance & safety.  Pt's knee buckling with 1st step-up requiring increased (A).  Cues for sequencing & technique.  Pt used bil UE"s on Lt rail.    Stair Management Technique One rail Left;Step to pattern;Forwards  Number of Stairs 2   Wheelchair Mobility  Wheelchair Mobility No  Posture/Postural Control  Posture/Postural Control No significant limitations  Balance  Balance Assessed No  Exercises  Exercises Total Joint  Total Joint Exercises  Ankle Circles/Pumps  AROM;Both;10 reps;Seated  Quad Sets AROM;Both;10 reps;Seated;Strengthening  Straight Leg Raises AROM;Strengthening;Left;10 reps;Seated  Long Arc Quad AROM;Strengthening;Left;10 reps;Seated  PT - End of Session  Equipment Utilized During Treatment Gait belt;Left knee immobilizer  Activity Tolerance Patient tolerated treatment well  Patient left in chair;with family/visitor present  Nurse Communication Mobility status for transfers;Mobility status for ambulation  General  Behavior During Session [facility] for tasks performed  Cognition [facility] for tasks performed  PT - Assessment/Plan  Comments on Treatment Session Pt progressing with PT goals.  Iniitiated stair training this session- Pt requests to practice again this afternoon.    PT Plan Discharge plan remains appropriate;Frequency remains appropriate  PT Frequency 7X/week  Follow Up Recommendations Home health PT  Equipment Recommended None recommended by OT  Acute Rehab PT Goals  PT Goal: Sit to Stand - Progress Progressing toward goal  PT Goal: Stand to Sit - Progress Progressing toward goal  PT Goal: Ambulate - Progress Progressing toward goal  PT Goal: Up/Down Stairs - Progress Progressing toward goal  PT Goal: Perform Home Exercise Program - Progress Progressing toward goal      Verdell Face, [place] [phone] 05/04/2011

## 2011-05-04 NOTE — Progress Notes (Signed)
Patient ID: Jeanne Cummings, female   DOB: 1946/01/14, 66 y.o.   MRN: [id number] PATIENT ID: Jeanne Cummings  MRN: [id number]  DOB/AGE:  09/19/1945 / 66 y.o.  2 Days Post-Op Procedure(s) (LRB): TOTAL KNEE ARTHROPLASTY (Left)    PROGRESS NOTE Subjective: Patient is alert, oriented, no Nausea, no Vomiting, yes} passing gas, no Bowel Movement. Taking PO no  Difficulty swallowing. Denies SOB, Chest or Calf Pain. Using Incentive Spirometer, PAS in place. Ambulate well, CPM 0-90 Patient reports pain as 6 on 0-10 scale  .   Significant difficultly  Objective: Vital signs in last 24 hours: Filed Vitals:   05/03/11 0800 05/03/11 1300 05/03/11 2228 05/04/11 0630  BP: 134/80 146/76 166/94 158/83  Pulse: 94 95 104 96  Temp: 98 F (36.7 C) 98.3 F (36.8 C) 97.5 F (36.4 C) 97.2 F (36.2 C)  TempSrc: Oral Oral    Resp: 18 17 20 19   Height:      Weight:      SpO2: 95%  96% 96%      Intake/Output from previous day: I/O last 3 completed shifts: In: 2860 [P.O.:1190; I.V.:1120; IV Piggyback:550] Out: 1275 [Urine:1150; Drains:125]   Intake/Output this shift:     LABORATORY DATA:  Basename 05/03/11 0958 05/03/11 0550  WBC 11.2* --  HGB 12.2 --  HCT 35.9* --  PLT 308 --  NA 134* --  K 4.0 --  CL 96 --  CO2 25 --  BUN 18 --  CREATININE 1.05 --  GLUCOSE 121* --  GLUCAP -- --  INR -- 1.05  CALCIUM 9.1 --    Examination: ABD soft Neurovascular intact Sensation intact distally Intact pulses distally Dorsiflexion/Plantar flexion intact Incision: dressing C/D/I}  Assessment:   2 Days Post-Op Procedure(s) (LRB): TOTAL KNEE ARTHROPLASTY (Left) ADDITIONAL DIAGNOSIS:  Acute Blood Loss Anemia and Hypertension Inflamed uvula Plan: PT/OT WBAT, CPM 5/hrs day until ROM 0-90 degrees, then D/C CPM DVT Prophylaxis:  [id number]\Coumadin for 2 weeks target INR 1.5-2.0 DISCHARGE PLAN: Home DISCHARGE NEEDS: HHPT, CPM, Walker and 3-in-1 comode seat ENT consult for swallowing Swallow eval by  speech therapy carafate    Jeanne Cummings J 05/04/2011, 7:04 AM

## 2011-05-04 NOTE — Progress Notes (Signed)
PT Progress Note:      05/04/11 1500  PT Visit Information  Last PT Received On 05/04/11  Precautions  Precautions Knee  Required Braces or Orthoses Knee Immobilizer - Left  Knee Immobilizer - Left On when out of bed or walking;Discontinue post op day 2  Restrictions  LLE Weight Bearing WBAT  Bed Mobility  Bed Mobility No  Transfers  Transfers Yes  Sit to Stand Other (comment);With armrests;With upper extremity assist;From chair/3-in-1 (Min Guard (A); from ortho gym mat table)  Sit to Stand Details (indicate cue type and reason) Guarding for safety.    Stand to Sit 6: Modified independent (Device/Increase time);With armrests;With upper extremity assist;To chair/3-in-1;Other (comment) (ortho gym mat table)  Ambulation/Gait  Ambulation/Gait Yes  Ambulation/Gait Assistance Other (comment) (Min Guard (A))  Ambulation/Gait Assistance Details (indicate cue type and reason) Cues for safe positioning/stepping inside RW, maintain upright posture.  Continues to ambulate without KI with no knee buckling noted.    Ambulation Distance (Feet) 120 Feet  Assistive device Rolling walker  Gait Pattern Step-to pattern;Decreased step length - right;Decreased stance time - left;Decreased hip/knee flexion - left  Stairs Yes  Stairs Assistance 4: Min assist  Stairs Assistance Details (indicate cue type and reason) (A) for balance due to knee buckling.  Pt's husband present & (A)'d with ascending/descending stairs 2nd trial.    Stair Management Technique One rail Left;Step to pattern;Sideways  Number of Stairs 2  (2x's. )  Wheelchair Mobility  Wheelchair Mobility No  Posture/Postural Control  Posture/Postural Control No significant limitations  Balance  Balance Assessed No  PT - End of Session  Equipment Utilized During Treatment Gait belt  Activity Tolerance Patient tolerated treatment well  Patient left in chair;with family/visitor present  Nurse Communication Mobility status for  transfers;Mobility status for ambulation  General  Behavior During Session [facility] for tasks performed  Cognition [facility] for tasks performed  PT - Assessment/Plan  Comments on Treatment Session Pt did better with stairs this session as evident in requiring decreased (A) but Lt knee continues to buckle- pt may benefit from use of KI with stairs if this continues.  No knee buckling noted with ambulation.    PT Plan Discharge plan remains appropriate;Frequency remains appropriate  PT Frequency 7X/week  Follow Up Recommendations Home health PT  Equipment Recommended None recommended by PT  Acute Rehab PT Goals  PT Goal: Sit to Stand - Progress Progressing toward goal  PT Goal: Stand to Sit - Progress Progressing toward goal  PT Goal: Ambulate - Progress Progressing toward goal  PT Goal: Up/Down Stairs - Progress Progressing toward goal      Verdell Face, [place] [phone] 05/04/2011

## 2011-05-04 NOTE — Progress Notes (Signed)
ANTICOAGULATION CONSULT NOTE -   Pharmacy Consult for Coumadin Indication: VTE prophylaxis  Allergies  Allergen Reactions  . Lidocaine   . Novacare Or   . Oxycodone-Acetaminophen   . Reglan     Vital Signs: Temp: 97.2 F (36.2 C) (04/17 0630) BP: 150/76 mmHg (04/17 0745) Pulse Rate: 92  (04/17 0745)  Labs:  Basename 05/04/11 0500 05/03/11 0958 05/03/11 0550  HGB 11.6* 12.2 --  HCT 34.4* 35.9* --  PLT 228 308 --  APTT -- -- --  LABPROT -- -- 13.9  INR -- -- 1.05  HEPARINUNFRC -- -- --  CREATININE 0.78 1.05 --  CKTOTAL -- -- --  CKMB -- -- --  TROPONINI -- -- --   Estimated Creatinine Clearance: 78.5 ml/min (by C-G formula based on Cr of 0.78).  Medical History: Past Medical History  Diagnosis Date  . PONV (postoperative nausea and vomiting)   . DVT, lower extremity     Left leg  . Bronchitis     hx of  . Pneumonia     hx of  . Depression   . Cancer 2007    Beast cancer. Chemo & radiation  . H/O hiatal hernia   . GERD (gastroesophageal reflux disease)     Takes Zantac  . Constipation     hx of  . Neuropathy     Bilateral feet  . Arthritis   . Hyperlipidemia     No meds due to adverse affects  . Left knee DJD      Assessment: 66 YOF s/p left total knee arthroplasty started coumadin for VTE prophylaxis on 05/02/11. She has hx of DVT. Discussed with  PA.   Plan is 1 month of coumadin with INR goal 2-3.  Coumadin score = 3.  INR not drawn today because pt pulled needle out during phlebotomy draw. Redrawn INR  =1. 53.  She has gotten coumadin 5 mg and 7.5 mg doses.  No bleeding reported. Goal of Therapy: INR 2-3  Plan:  - Coumadin 5mg  po x 1 - daily PT/INR --pt with hx of DVT, also has SCDs on -on LMWH 30 mg sq q12h Herby Abraham, Pharm.D. [phone] 05/04/2011 10:11 AM

## 2011-05-05 DIAGNOSIS — D62 Acute posthemorrhagic anemia: Secondary | ICD-10-CM | POA: Diagnosis not present

## 2011-05-05 LAB — BASIC METABOLIC PANEL
BUN: 16 mg/dL (ref 6–23)
Calcium: 9.4 mg/dL (ref 8.4–10.5)
GFR calc Af Amer: 79 mL/min — ABNORMAL LOW (ref >90)
GFR calc non Af Amer: 68 mL/min — ABNORMAL LOW (ref >90)
Glucose, Bld: 96 mg/dL (ref 70–99)
Potassium: 3.9 mEq/L (ref 3.5–5.1)

## 2011-05-05 LAB — CBC
HCT: 33.3 % — ABNORMAL LOW (ref 36.0–46.0)
Hemoglobin: 10.8 g/dL — ABNORMAL LOW (ref 12.0–15.0)
MCH: 29.5 pg (ref 26.0–34.0)
MCHC: 32.4 g/dL (ref 30.0–36.0)
RDW: 13.6 % (ref 11.5–15.5)

## 2011-05-05 LAB — PROTIME-INR
INR: 1.74 — ABNORMAL HIGH (ref 0.00–1.49)
Prothrombin Time: 20.7 seconds — ABNORMAL HIGH (ref 11.6–15.2)

## 2011-05-05 MED ORDER — WARFARIN SODIUM 5 MG PO TABS: 5.0000 mg | ORAL_TABLET | Freq: Every day | ORAL | Status: DC

## 2011-05-05 MED ORDER — DOCUSATE SODIUM 100 MG PO CAPS: 100.0000 mg | ORAL_CAPSULE | Freq: Two times a day (BID) | ORAL | Status: AC

## 2011-05-05 MED ORDER — RABEPRAZOLE SODIUM 20 MG PO TBEC: 20.0000 mg | DELAYED_RELEASE_TABLET | Freq: Every morning | ORAL | Status: AC

## 2011-05-05 MED ORDER — SUCRALFATE 1 GM/10ML PO SUSP: 1.0000 g | Freq: Three times a day (TID) | ORAL | Status: AC

## 2011-05-05 MED ORDER — TAPENTADOL HCL 50 MG PO TABS: 50.0000 mg | ORAL_TABLET | ORAL | Status: DC | PRN

## 2011-05-05 MED ORDER — PREDNISONE (PAK) 10 MG PO TABS: ORAL_TABLET | ORAL | Status: DC

## 2011-05-05 MED ORDER — RANITIDINE HCL 150 MG PO TABS: 300.0000 mg | ORAL_TABLET | Freq: Every day | ORAL | Status: AC

## 2011-05-05 MED ORDER — BISACODYL 5 MG PO TBEC: 10.0000 mg | DELAYED_RELEASE_TABLET | Freq: Every day | ORAL | Status: AC | PRN

## 2011-05-05 NOTE — Progress Notes (Signed)
ANTICOAGULATION CONSULT NOTE -   Pharmacy Consult for Coumadin Indication: VTE prophylaxis  Allergies  Allergen Reactions  . Lidocaine   . Novacare Or   . Oxycodone-Acetaminophen   . Reglan     Vital Signs: Temp: 98.3 F (36.8 C) (04/18 0541) BP: 169/92 mmHg (04/18 0541) Pulse Rate: 83  (04/18 0541)  Labs:  Basename 05/05/11 0615 05/04/11 1100 05/04/11 0500 05/03/11 0958 05/03/11 0550  HGB 10.8* -- 11.6* -- --  HCT 33.3* -- 34.4* 35.9* --  PLT 290 -- 228 308 --  APTT -- -- -- -- --  LABPROT 20.7* 18.7* -- -- 13.9  INR 1.74* 1.53* -- -- 1.05  HEPARINUNFRC -- -- -- -- --  CREATININE 0.87 -- 0.78 1.05 --  CKTOTAL -- -- -- -- --  CKMB -- -- -- -- --  TROPONINI -- -- -- -- --   Estimated Creatinine Clearance: 72.2 ml/min (by C-G formula based on Cr of 0.87).  Assessment: 75 YOF s/p left total knee arthroplasty started coumadin for VTE prophylaxis on 05/02/11. She has hx of DVT. Discussed with  PA.   Plan is 1 month of coumadin with INR goal 2-3.  Coumadin score = 3.   INR  =1. 74 today.   She has gotten coumadin 5 mg,7.5 mg, 5 mg doses.  No bleeding reported. To dc home today Goal of Therapy: INR 2-3  Plan:  - dc home on Coumadin 5mg  daily rx for 5 mg tabs # 60, no refills Herby Abraham, Pharm.D. [phone] 05/05/2011 8:49 AM

## 2011-05-05 NOTE — Progress Notes (Signed)
PT Progress Note:       05/05/11 1100  PT Visit Information  Last PT Received On 05/05/11  Precautions  Precautions Knee  Required Braces or Orthoses Knee Immobilizer - Left  Knee Immobilizer - Left Other (comment);Discontinue post op day 2  Restrictions  Weight Bearing Restrictions Yes  LLE Weight Bearing WBAT  Bed Mobility  Bed Mobility Yes  Supine to Sit Other (comment);HOB flat (Min Guard (A))  Supine to Sit Details (indicate cue type and reason) Cues for hooking technique (Rt foot under Lt ankle) to support & manage Lt LE.    Transfers  Transfers Yes  Sit to Stand Other (comment);From bed;From chair/3-in-1;With upper extremity assist (Min Guard (A); [employer] mat table)  Sit to Stand Details (indicate cue type and reason) Slightly unsteady to at midpoint when advancing hands to RW., but no physical (A) required.    Stand to Sit 6: Modified independent (Device/Increase time);With armrests;With upper extremity assist;To chair/3-in-1;Other (comment)  Ambulation/Gait  Ambulation/Gait Yes  Ambulation/Gait Assistance 5: Supervision  Ambulation/Gait Assistance Details (indicate cue type and reason) Cues to increase Rt step length & keep head up.    Ambulation Distance (Feet) 120 Feet  Assistive device Rolling walker  Gait Pattern Step-to pattern;Decreased stance time - left;Decreased step length - right  Stairs Yes  Stairs Assistance Other (comment) (Pt's husband provided (A)/ Guarding for training.  )  Stairs Assistance Details (indicate cue type and reason) Pt states knee did not buckle with stairs this session but still appears to have slight difficulty ascending stairs smoothly.    Stair Management Technique One rail Left;Step to pattern;Sideways  Number of Stairs 3   Wheelchair Mobility  Wheelchair Mobility No  Posture/Postural Control  Posture/Postural Control No significant limitations  Balance  Balance Assessed No  Exercises  Exercises Total Joint  Total Joint  Exercises  Ankle Circles/Pumps AROM;Both;15 reps;Supine  Quad Sets AROM;Strengthening;Both;15 reps;Supine  Heel Slides AAROM;Strengthening;Left;Other reps (comment);Supine (12 reps)  Hip ABduction/ADduction AAROM;Strengthening;Left;Other reps (comment);Supine (12 reps)  Straight Leg Raises AAROM;Strengthening;Left;Other reps (comment);Supine (12 reps)  PT - End of Session  Equipment Utilized During Treatment Gait belt  Activity Tolerance Patient tolerated treatment well  Patient left in chair;with family/visitor present  General  Behavior During Session [facility] for tasks performed  [facility] for tasks performed  PT - Assessment/Plan  PT Plan Discharge plan remains appropriate;Frequency remains appropriate  PT Frequency 7X/week  Follow Up Recommendations Home health PT  Equipment Recommended None recommended by PT  Acute Rehab PT Goals  PT Goal: Supine/Side to Sit - Progress Progressing toward goal  PT Goal: Sit to Stand - Progress Progressing toward goal  PT Goal: Stand to Sit - Progress Met  PT Goal: Ambulate - Progress Progressing toward goal  PT Goal: Up/Down Stairs - Progress Progressing toward goal  PT Goal: Perform Home Exercise Program - Progress Progressing toward goal      Jeanne Cummings, [place] [phone] 05/05/2011

## 2011-05-05 NOTE — Progress Notes (Signed)
Speech Language Pathology Dysphagia Treatment  Patient Details Name: Jeanne Cummings MRN: [id number] DOB: 1945/08/28 Today's Date: 05/05/2011  SLP Assessment/Plan/Recommendation Assessment / Recommendations / Plan Clinical Impression Statement: Pt. reports improved swallow ability since yesterday.  Able to eat jello last night.  Consumed pudding with small pieces graham cracker and reported the cracker "hurt a little as it goes down" but it is not sticking in pharynx.  Recommend smooth foods with mild amount of texture once home (yogurt mixed with furits, soups with vegetable etc).  She stated  she had a cervical fusion and had difficulty swalllowing for a week last year. Pt. scheduled to leave today. Continue with Current Diet: Dysphagia 2 (fine chop);Thin liquid Liquids provided via: Cup Medication Administration: Crushed with puree Supervision: Patient able to self feed Compensations: Slow rate;Small sips/bites Postural Changes and/or Swallow Maneuvers: Out of bed for meals;Seated upright 90 degrees;Upright 30-60 min after meal Oral Care Recommendations: Oral care BID Plan: All goals met;Discharge SLP treatment due to (comment);Other (Comment) (all goals met) General Respiratory Status: Room air Behavior/Cognition: Alert;Cooperative Oral Cavity - Dentition: Adequate natural dentition Patient Positioning: Upright in chair  Oral Cavity - Oral Hygiene Does patient have any of the following "at risk" factors?: None of the above Brush patient's teeth BID with toothbrush (using toothpaste with fluoride): Yes   Dysphagia Treatment Treatment focused on: Skilled observation of diet tolerance;Upgraded PO texture trials Treatment Methods/Modalities: Skilled observation Patient observed directly with PO's: Yes Type of PO's observed: Dysphagia 2 (chopped) Feeding: Able to feed self Liquids provided via: Teaspoon Type of cueing: Verbal Amount of cueing: Minimal   Royce Macadamia M.Ed  [employer] [phone]  05/05/2011

## 2011-05-05 NOTE — Discharge Summary (Signed)
Physician Discharge Summary  Patient ID: Jeanne Cummings MRN: [id number] DOB/AGE: May 04, 1945 66 y.o.  Admit date: 05/02/2011 Discharge date: 05/05/2011  Admission Diagnoses: Past Medical History  Diagnosis Date  . PONV (postoperative nausea and vomiting)   . DVT, lower extremity     Left leg  . Bronchitis     hx of  . Pneumonia     hx of  . Depression   . Cancer 2007    Beast cancer. Chemo & radiation  . H/O hiatal hernia   . GERD (gastroesophageal reflux disease)     Takes Zantac  . Constipation     hx of  . Neuropathy     Bilateral feet  . Arthritis   . Hyperlipidemia     No meds due to adverse affects  . Left knee DJD    Discharge Diagnoses:  Principal Problem:  *Left knee DJD Active Problems:  PONV (postoperative nausea and vomiting)  DVT, lower extremity  GERD (gastroesophageal reflux disease)  Neuropathy  Arthritis  Postoperative anemia due to acute blood loss   Discharged Condition: good  Hospital Course: 05/02/2011   Patient had a left total knee replacement by Dr Thurston Hole and a femoral nerve block by anesthesia.  05/03/11  Pt had difficulty with pain control, itching, and difficulty swallowing.  Anesthesia evaluated patient for her swallowing and gave her Decadron for a swollen uvula.  Benedryl was added for itching.  She did well with physical therapy.  05/04/2011 Patient unable to eat or drink due to sore throat, reflux, and swollen uvula.  ENT consult called.  Dr Jearld Fenton saw her.  He doubled her Zantac to 300 mg at night, protonix 80 mg in the morning instead of aciphex and Carafate 1 gram 4 times a day.  She was give another dose of Decadron 10 mg.  She had a bowel movement after 10 mg Dulcolax suppository.  05/05/2011  Patient significantly better today.  Still some heartburn.  Wound well approximated and healing.  Going home today in stable condition, weight bearing as tolerated on a regular diet  Consults: ENT  Significant Diagnostic Studies:  Results for  orders placed during the hospital encounter of 05/02/11 (from the past 72 hour(s))  PROTIME-INR     Status: Normal   Collection Time   05/03/11  5:50 AM      Component Value Range Comment   Prothrombin Time 13.9  11.6 - 15.2 (seconds)    INR 1.05  0.00 - 1.49    CBC     Status: Abnormal   Collection Time   05/03/11  9:58 AM      Component Value Range Comment   WBC 11.2 (*) 4.0 - 10.5 (K/uL)    RBC 3.96  3.87 - 5.11 (MIL/uL)    Hemoglobin 12.2  12.0 - 15.0 (g/dL)    HCT 82.9 (*) 56.2 - 46.0 (%)    MCV 90.7  78.0 - 100.0 (fL)    MCH 30.8  26.0 - 34.0 (pg)    MCHC 34.0  30.0 - 36.0 (g/dL)    RDW 13.0  86.5 - 78.4 (%)    Platelets 308  150 - 400 (K/uL)   BASIC METABOLIC PANEL     Status: Abnormal   Collection Time   05/03/11  9:58 AM      Component Value Range Comment   Sodium 134 (*) 135 - 145 (mEq/L)    Potassium 4.0  3.5 - 5.1 (mEq/L)    Chloride 96  96 - 112 (mEq/L)    CO2 25  19 - 32 (mEq/L)    Glucose, Bld 121 (*) 70 - 99 (mg/dL)    BUN 18  6 - 23 (mg/dL)    Creatinine, Ser 4.54  0.50 - 1.10 (mg/dL)    Calcium 9.1  8.4 - 10.5 (mg/dL)    GFR calc non Af Amer 54 (*) >90 (mL/min)    GFR calc Af Amer 63 (*) >90 (mL/min)   CBC     Status: Abnormal   Collection Time   05/04/11  5:00 AM      Component Value Range Comment   WBC 13.5 (*) 4.0 - 10.5 (K/uL)    RBC 3.82 (*) 3.87 - 5.11 (MIL/uL)    Hemoglobin 11.6 (*) 12.0 - 15.0 (g/dL)    HCT 09.8 (*) 11.9 - 46.0 (%)    MCV 90.1  78.0 - 100.0 (fL)    MCH 30.4  26.0 - 34.0 (pg)    MCHC 33.7  30.0 - 36.0 (g/dL)    RDW 14.7  82.9 - 56.2 (%)    Platelets 228  150 - 400 (K/uL) DELTA CHECK NOTED  BASIC METABOLIC PANEL     Status: Abnormal   Collection Time   05/04/11  5:00 AM      Component Value Range Comment   Sodium 137  135 - 145 (mEq/L)    Potassium 4.4  3.5 - 5.1 (mEq/L)    Chloride 100  96 - 112 (mEq/L)    CO2 25  19 - 32 (mEq/L)    Glucose, Bld 105 (*) 70 - 99 (mg/dL)    BUN 14  6 - 23 (mg/dL)    Creatinine, Ser 1.30  0.50  - 1.10 (mg/dL)    Calcium 9.3  8.4 - 10.5 (mg/dL)    GFR calc non Af Amer 85 (*) >90 (mL/min)    GFR calc Af Amer >90  >90 (mL/min)   PROTIME-INR     Status: Abnormal   Collection Time   05/04/11 11:00 AM      Component Value Range Comment   Prothrombin Time 18.7 (*) 11.6 - 15.2 (seconds)    INR 1.53 (*) 0.00 - 1.49    PROTIME-INR     Status: Abnormal   Collection Time   05/05/11  6:15 AM      Component Value Range Comment   Prothrombin Time 20.7 (*) 11.6 - 15.2 (seconds)    INR 1.74 (*) 0.00 - 1.49    CBC     Status: Abnormal   Collection Time   05/05/11  6:15 AM      Component Value Range Comment   WBC 12.6 (*) 4.0 - 10.5 (K/uL)    RBC 3.66 (*) 3.87 - 5.11 (MIL/uL)    Hemoglobin 10.8 (*) 12.0 - 15.0 (g/dL)    HCT 86.5 (*) 78.4 - 46.0 (%)    MCV 91.0  78.0 - 100.0 (fL)    MCH 29.5  26.0 - 34.0 (pg)    MCHC 32.4  30.0 - 36.0 (g/dL)    RDW 69.6  29.5 - 28.4 (%)    Platelets 290  150 - 400 (K/uL)     Treatments: IV hydration, antibiotics: Ancef, analgesia: Nucynta, failed Dilaudid, Norco, anticoagulation: LMW heparin and warfarin, steroids: decadron, therapies: PT, OT and ST and surgery: left total knee  Discharge Exam: Blood pressure 169/92, pulse 83, temperature 98.3 F (36.8 C), temperature source Oral, resp. rate 16, height 5\' 3"  (1.6 m),  weight 101.152 kg (223 lb), SpO2 100.00%. General appearance: alert, cooperative and appears stated age Throat: normal findings: lips normal without lesions, buccal mucosa normal and gums healthy and abnormal findings: moderate oropharyngeal erythema Resp: clear to auscultation bilaterally Extremities: extremities normal, atraumatic, no cyanosis or edema Incision/Wound:well approximated   Minimal drainage  Disposition: stable  Discharge Orders    Future Orders Please Complete By Expires   Diet - low sodium heart healthy      Call MD / Call 911      Comments:   If you experience chest pain or shortness of breath, CALL 911 and be  transported to the hospital emergency room.  If you develope a fever above 101 F, pus (white drainage) or increased drainage or redness at the wound, or calf pain, call your surgeon's office.   Constipation Prevention      Comments:   Drink plenty of fluids.  Prune juice may be helpful.  You may use a stool softener, such as Colace (over the counter) 100 mg twice a day.  Use MiraLax (over the counter) for constipation as needed.   Increase activity slowly as tolerated      Weight Bearing as taught in Physical Therapy      Comments:   Use a walker or crutches as instructed.   CPM      Comments:   Continuous passive motion machine (CPM):      Use the CPM from 0 to 90 for 6 hours per day.      You may break it up into 2 or 3 sessions per day.      Use CPM for 2 weeks or until you are told to stop.   TED hose      Comments:   Use stockings (TED hose) for 2 weeks on both leg(s).  You may remove them at night for sleeping.   Change dressing      Comments:   Change the dressing daily with sterile 4 x 4 inch gauze dressing and apply TED hose.  You may clean the incision with alcohol prior to redressing.   Do not put a pillow under the knee. Place it under the heel.      Comments:   Place yellow foam block, yellow side up under heel at all times except when in CPM or when walking.  DO NOT modify, tear, cut, or change in any way the yellow foam block.     Medication List  As of 05/05/2011  7:50 AM   STOP taking these medications         diclofenac-misoprostol 75-200 MG-MCG per tablet         TAKE these medications         bisacodyl 5 MG EC tablet   Commonly known as: DULCOLAX   Take 2 tablets (10 mg total) by mouth daily as needed for constipation.      docusate sodium 100 MG capsule   Commonly known as: COLACE   Take 1 capsule (100 mg total) by mouth 2 (two) times daily.      FOLIC ACID PO   Take 1 tablet by mouth daily. 600 mcg      magnesium oxide 400 MG tablet   Commonly known  as: MAG-OX   Take 400 mg by mouth every evening.      OVER THE COUNTER MEDICATION   Take 5 tablets by mouth daily. Organic triple fiber pills.  Pt takes all 5 tabs at once  predniSONE 10 MG tablet   Commonly known as: STERAPRED UNI-PAK   Take as directed for 6 days      RABEprazole 20 MG tablet   Commonly known as: ACIPHEX   Take 1 tablet (20 mg total) by mouth every morning.      ranitidine 150 MG tablet   Commonly known as: ZANTAC   Take 2 tablets (300 mg total) by mouth at bedtime.      sucralfate 1 GM/10ML suspension   Commonly known as: CARAFATE   Take 10 mLs (1 g total) by mouth 4 (four) times daily -  with meals and at bedtime.      tapentadol 50 MG Tabs   Commonly known as: NUCYNTA   Take 1-2 tablets (50-100 mg total) by mouth every 4 (four) hours as needed.      venlafaxine XR 75 MG 24 hr capsule   Commonly known as: EFFEXOR-XR   Take 75 mg by mouth 2 (two) times daily.      VITAMIN B 12 PO   Inject 5 mg as directed every 30 (thirty) days.      Vitamin D (Ergocalciferol) 50000 UNITS Caps   Commonly known as: DRISDOL   Take 50,000 Units by mouth every 7 (seven) days. thursdays      warfarin 5 MG tablet   Commonly known as: COUMADIN   Take 1 tablet (5 mg total) by mouth daily.           Follow-up Information    Follow up with Nilda Simmer, MD on 05/16/2011. (appt 2:30)    Contact information:   [facility] [address] [place] [address] [phone]          Signed: Pascal Lux 05/05/2011, 7:50 AM

## 2011-09-14 IMAGING — CT CT L SPINE W/ CM
3 of 16 series · 7 of 34 positions shown, 8 images · IV contrast (omnipaque)
Comparison: None.

CLINICAL DATA: Neck pain.  Back pain.

MYELOGRAM CERVICAL AND limited THORACIC AND LUMBAR
TECHNIQUE: Lumbar was performed by Dr. Jumper at L2-L3 on the left.
Following injection of intrathecal Omnipaque contrast, spine
imaging in multiple projections was performed using fluoroscopy.
TECHNIQUE: CT imaging of the cervical spine was performed after
intrathecal contrast administration. Multiplanar CT image
reconstructions were also generated.
TECHNIQUE: CT imaging of the lumbar spine was performed after
intrathecal contrast administration.  Multiplanar CT image

[Series 6: l-spine · axial · 0.27mm/px · z∈[-323,-80]mm · 3 of 98 slices shown, 4 images]
[im 1/98  soft-tissue]
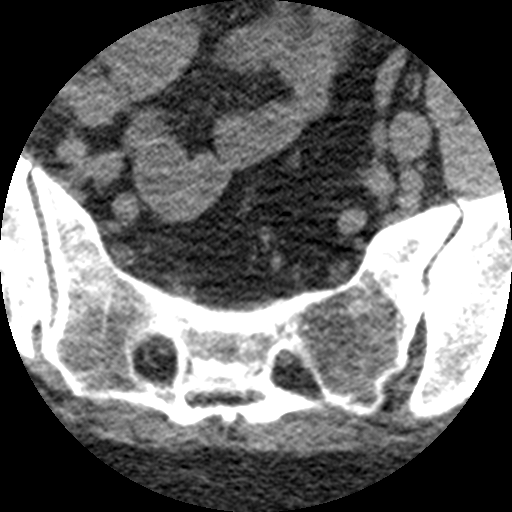
[im 1/98  bone]
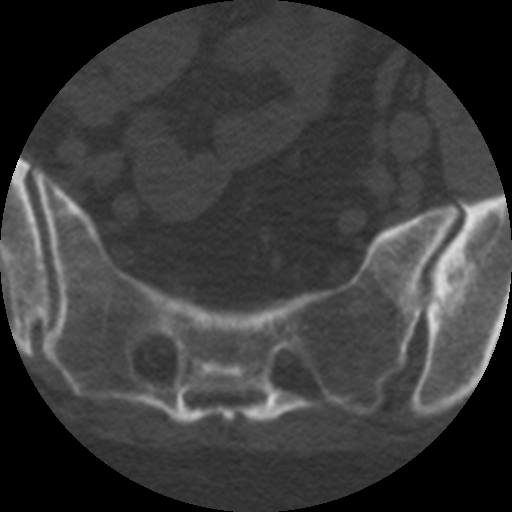
[im 49/98  bone]
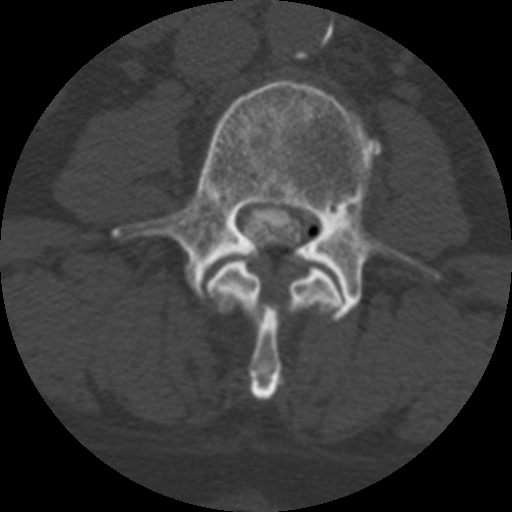
[im 98/98  bone]
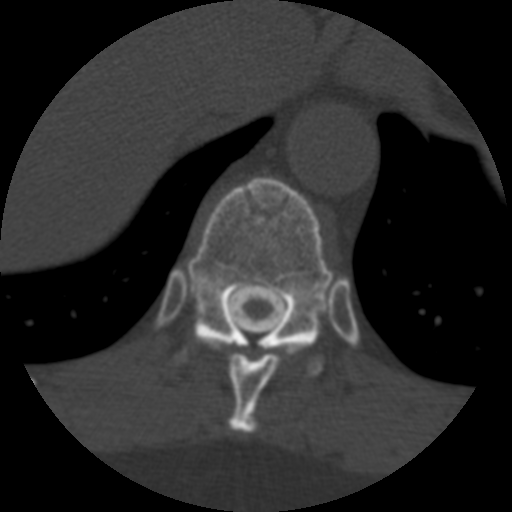

[Series 103: sag det c · sagittal · 0.38mm/px · 1 of 66 slices shown]
[im 33/66  bone]
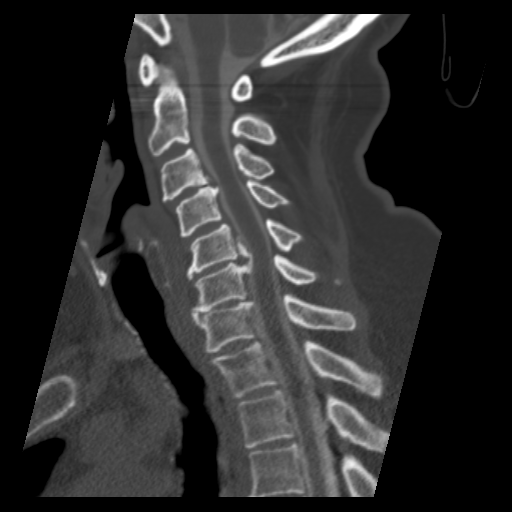

[Series 104: cor det c · coronal · 0.38mm/px · 3 of 52 slices shown]
[im 15/52  bone]
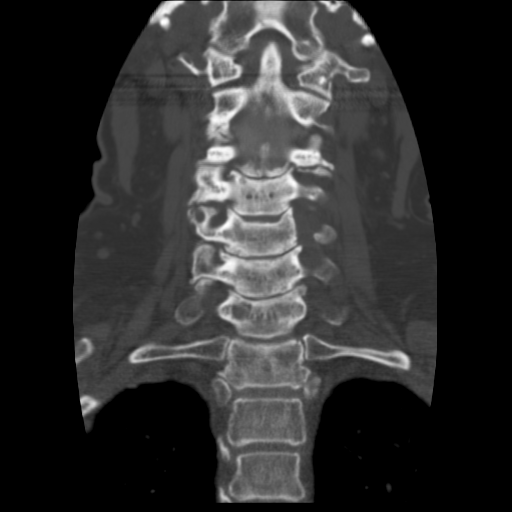
[im 30/52  bone]
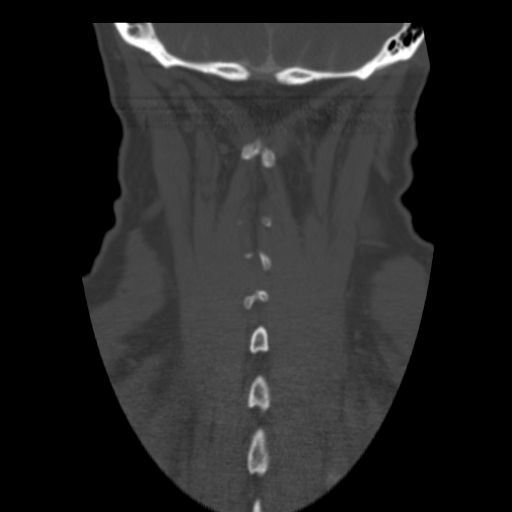
[im 45/52  bone]
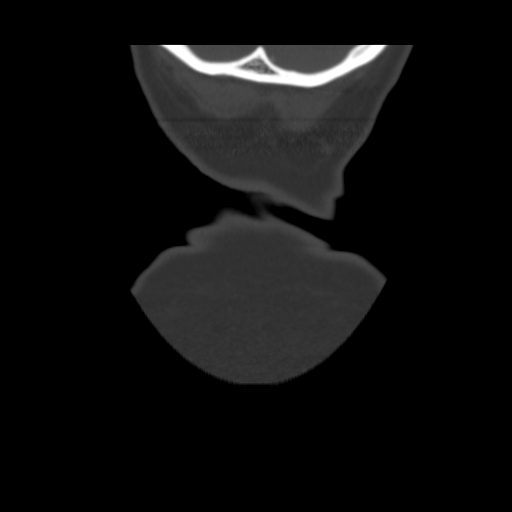

[7 of 34 positions shown; findings below may reference images not displayed]

FINDINGS: In the lumbar region multiple extradural defects are
seen.   There is significant waist like narrowing at L4-5, left
greater than right.  There is a broad extradural defect along the
left side of L3-4.  There is poor visualization of the lumbar
subarachnoid space below L4-5 due to stenosis.  There is
significant stenosis at the L2-3 level, right greater than left
accompanied by asymmetric loss of interspace height on the right.
Mild anterolisthesis of L4 on L5 is noted.  Flexion/extension was
not performed.

Contrast was maneuvered in the cervical region.  There is
significant stenosis at C5-6 with bilateral C6 nerve root
encroachment left greater than right.  Moderate to severe stenosis
is present at C3-4, with cord compression and  bilateral C4 nerve
root encroachment, worse on the left.  Moderate stenosis at C4-5
compresses both C5 nerve roots.

Fluoroscopy Time: 1.32 minutes
IMPRESSION: As above.

CT MYELOGRAPHY CERVICAL SPINE
FINDINGS: No prevertebral or paraspinous masses.  Lung apices
clear.

C2-3: Asymmetric facet arthropathy on the right.  No compressive
lesion.

C3-4: 2 mm anterolisthesis is facet mediated.  Severe facet
arthropathy on the left.  Bilateral uncinate spurring with
calcified central protrusion and osteophyte formation.  Mild cord
flattening with bilateral C4 neural encroachment, left greater than
right.  Canal diameter 8 mm.

C4-5: Advanced facet arthropathy.  Mild uncinate spurring.  Mild
bilateral C5 nerve root encroachment.

C5-6: Calcified central protrusion is indistinguishable from
ossification of the posterior longitudinal ligament.  This results
in moderate cord compression and bilateral C6 nerve root
encroachment; canal diameter 7 mm.

C6-7: Central and leftward protrusion / uncinate spurring.  Left C7
nerve root encroachment. Large geode located centrally and
rightward within the C7 vertebral body.

C7-T1: Normal.
IMPRESSION: Multilevel spondylosis as described.  The most significant
compression is at C5-6 where a combination of
protrusion/ossification of the posterior longitudinal ligament
compresses both the spinal cord and exiting C6 nerve roots.

CT MYELOGRAPHY LUMBAR SPINE
FINDINGS: No prevertebral or paraspinous masses.

T11-T12:  Calcified central protrusion and ligamentum flavum
hypertrophy with facet arthropathy.  Mild stenosis but no neural
compression.

T12-L1:  Tiny calcified central protrusion.  Left-sided ligamentum
flavum hypertrophy.  No compressive lesion.

L1-2: Minimal disc pathology.  Mild facet arthropathy.  No
compressive lesion.

L2-3: Central and rightward protrusion is accompanied by asymmetric
loss of interspace height, also on the right.  Advanced posterior
element hypertrophy.  Moderate central canal stenosis with
bilateral L2 and bilateral L3 neural encroachment.

L3-4: Nitrogen gas in the canal extends broadly behind the L3 and
L4 vertebral body and is roughly centered at the L3-4 disc space.
There is a central and leftward protrusion at this level, but no
vacuum disc phenomenon is seen within the L3-4 disc.  It is unclear
if this nitrogen gas is iatrogenic, from lumbar puncture, or
represents a free fragment extending down from L2-3/up from L4-5.
Correlate clinically.  Mass effect on the thecal sac is noted along
with bilateral L4 nerve root encroachment.  Bilateral neural
foraminal narrowing is present which may affect both L3 nerve roots

L4-5: 2 mm anterolisthesis is accompanied by central disc
protrusion.  There is an extraforaminal component, right greater
than left.  Large synovial cyst on the left measures 6 x 10 mm and
is accompanied by marked facet arthropathy.  Left greater than
right L5 neural encroachment in the canal.  Right greater than left
L4 nerve encroachment in the foramina and extraforaminal soft
tissues.

L5-S1: Calcified central protrusion is noted with bilateral facet
arthropathy.  No significant spinal stenosis or S1 nerve root
compression in the canal.  There is severe bilateral L5 nerve root
encroachment in the foramina.
IMPRESSION: Multilevel spondylosis, moderate to severe from L2-S1.  There is
significant nerve root compression of varying degrees as described
above.

## 2013-07-09 IMAGING — CR DG CHEST 2V
2 series · 2 of 2 positions shown · non-contrast
Comparison: None.

CLINICAL DATA: Preop total knee replacement.  Hypertension.
History of breast cancer.

CHEST - 2 VIEW

[view not recorded (1 of 2)]
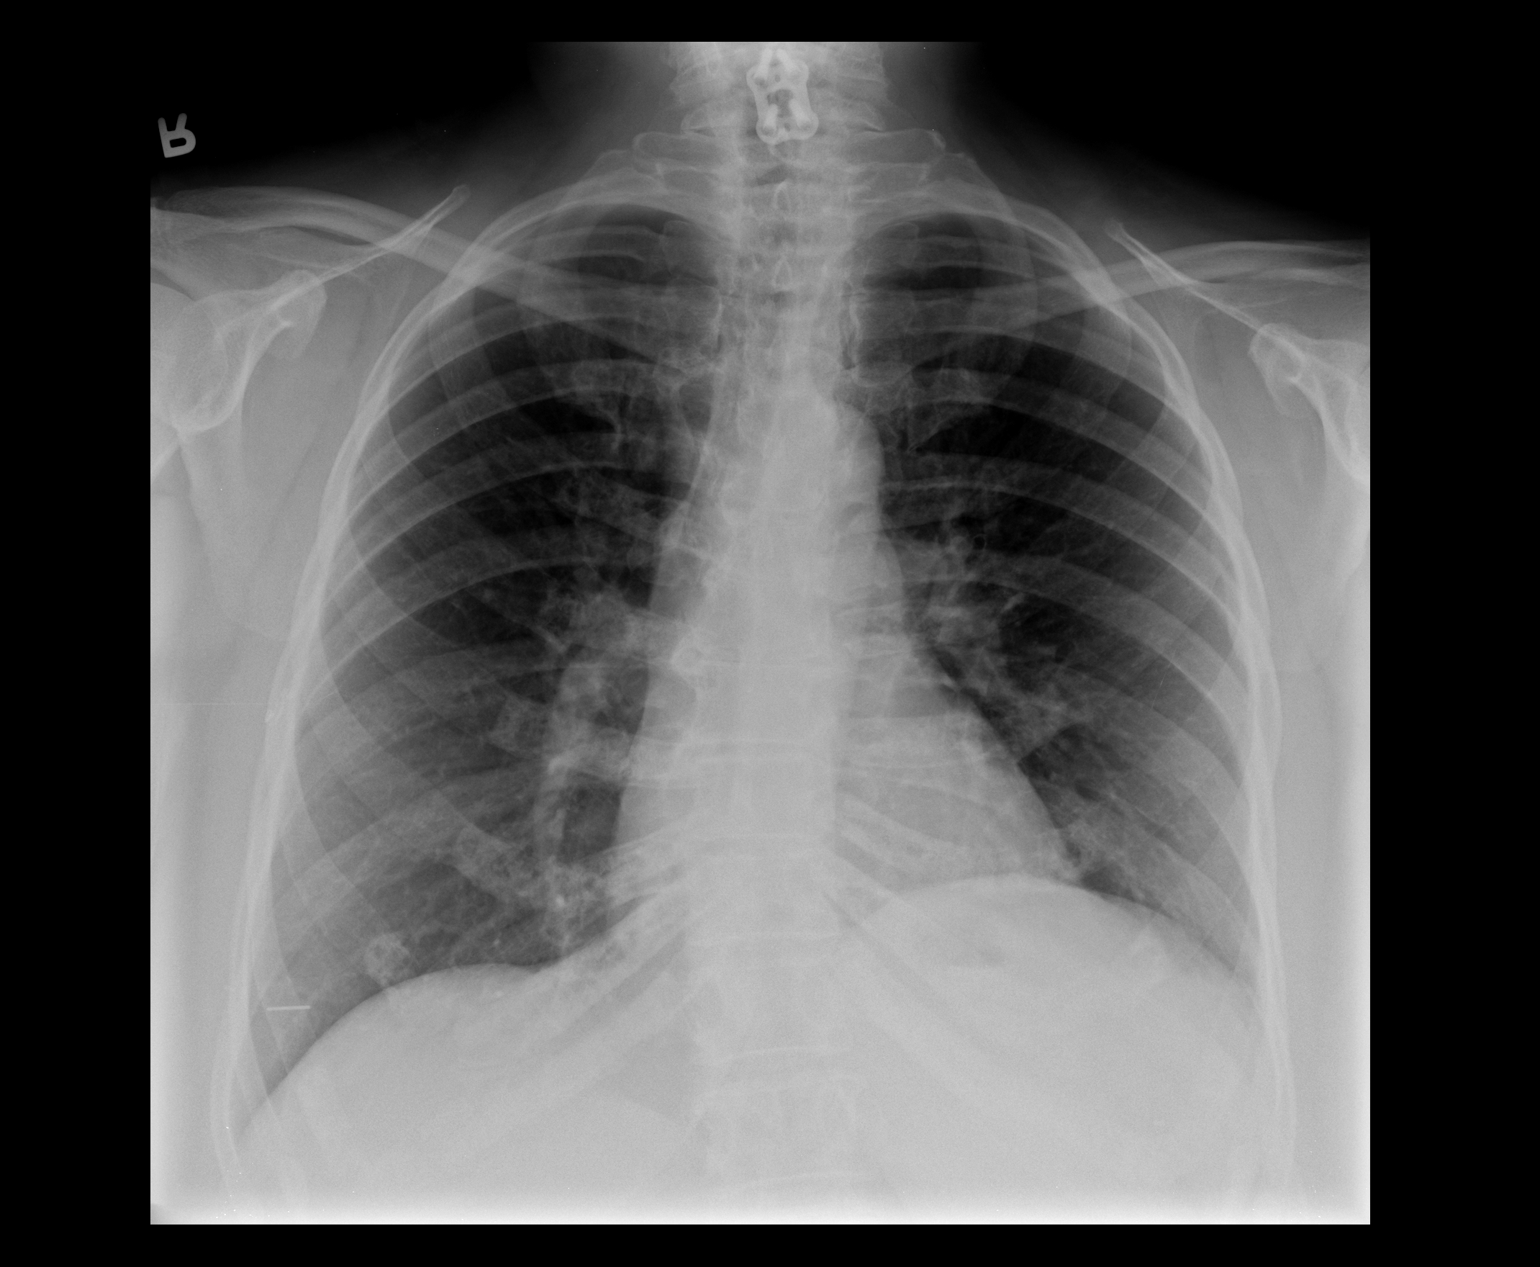

[view not recorded (2 of 2)]
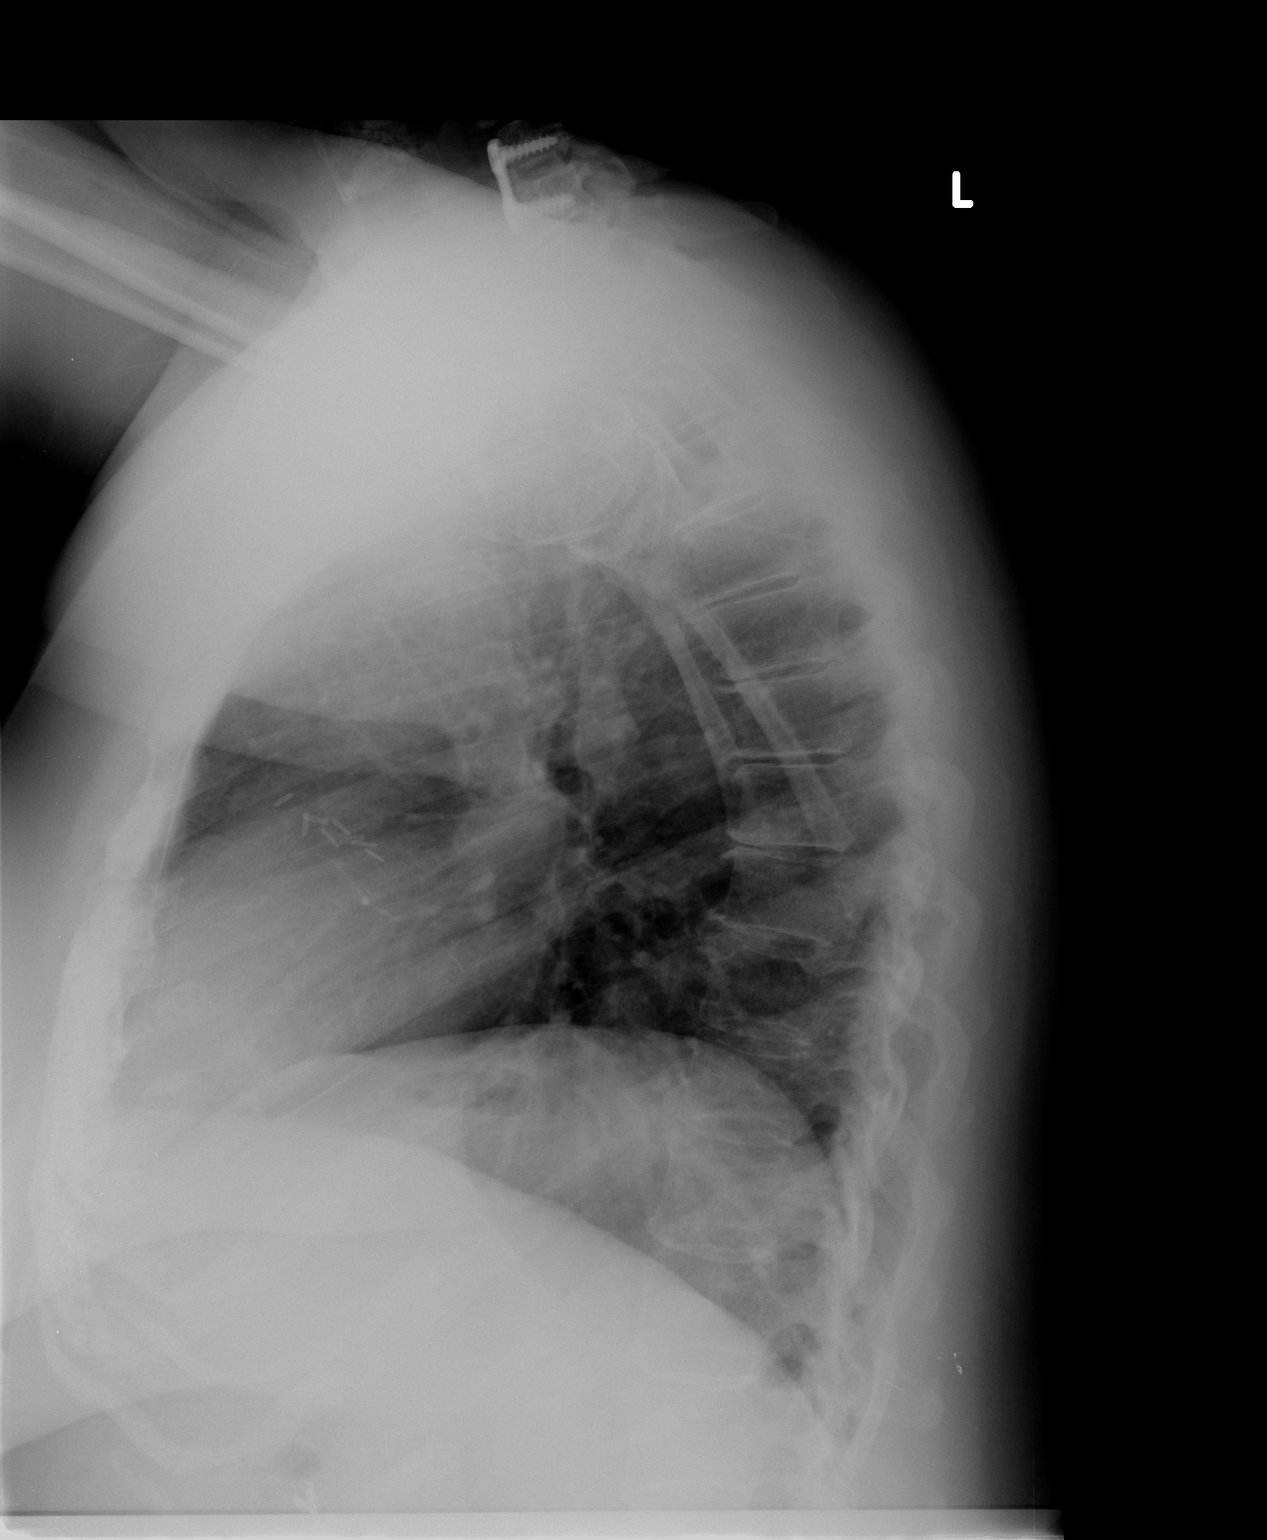

[2 of 2 positions shown; findings below may reference images not displayed]

FINDINGS: Slight elevation of the left hemidiaphragm.  Heart is
normal size.  Lungs are clear.  No effusions.  Surgical clips in
the right breast.  No acute bony abnormality.  Degenerative changes
in the thoracic spine.
IMPRESSION: No active cardiopulmonary disease.

## 2014-03-11 ENCOUNTER — Encounter: Payer: Self-pay | Admitting: Podiatry

## 2014-03-11 ENCOUNTER — Ambulatory Visit (INDEPENDENT_AMBULATORY_CARE_PROVIDER_SITE_OTHER): Payer: Medicare Other | Admitting: Podiatry

## 2014-03-11 VITALS — BP 149/85 | HR 73 | Resp 16

## 2014-03-11 DIAGNOSIS — M2012 Hallux valgus (acquired), left foot: Secondary | ICD-10-CM

## 2014-03-11 DIAGNOSIS — M2042 Other hammer toe(s) (acquired), left foot: Secondary | ICD-10-CM

## 2014-03-11 DIAGNOSIS — M21612 Bunion of left foot: Secondary | ICD-10-CM

## 2014-03-11 DIAGNOSIS — G629 Polyneuropathy, unspecified: Secondary | ICD-10-CM

## 2014-03-11 NOTE — Progress Notes (Signed)
   Subjective:    Patient ID: Jeanne Cummings, female    DOB: [birth year], 69 y.o.   MRN: [id number]  HPI Comments: "I have problem with this toe and foot"  Patient c/o tight and numbness in the left foot for several months. She has a bunion and hammer toe, 2nd left. She has had back surgery and her doctors states that most of what she is feeling is from the back. She also has neuropathy from chemo. Dr. Kem Parkinson and sent here for treatment.  Toe Pain  Associated symptoms include numbness.      Review of Systems  Constitutional: Positive for activity change and fatigue.  Respiratory: Positive for apnea, chest tightness, shortness of breath and wheezing.   Cardiovascular: Positive for leg swelling.  Musculoskeletal: Positive for myalgias, back pain, arthralgias and gait problem.  Neurological: Positive for weakness and numbness.  All other systems reviewed and are negative.      Objective:   Physical Exam        Assessment & Plan:

## 2014-03-11 NOTE — Progress Notes (Signed)
Subjective:     Patient ID: Jeanne Cummings, female   DOB: 17-Jun-1945, 69 y.o.   MRN: [id number]  HPI patient presents stating that she has had long-term issues with her back and nerve issues on her left side and has pain in her toes and is concerned about her bunion and hammertoe being part of this problem   Review of Systems  All other systems reviewed and are negative.      Objective:   Physical Exam  Constitutional: She is oriented to person, place, and time.  Cardiovascular: Intact distal pulses.   Musculoskeletal: Normal range of motion.  Neurological: She is oriented to person, place, and time.  Skin: Skin is warm and dry.  Nursing note and vitals reviewed.  I noted pulses to be intact but I did note there to be quite a bit of diminishment of sharp [employer] vibratory and signs Weinstein testing on the left plantar foot and into the ankle. Patient's noted to have diminished muscle strength left side and does have mild structural bunion deformity with redness and elevated second toe left of a moderate rigid contracture. Digits are well-perfused and she's well oriented 3 and has caregiver with her     Assessment:     I believe her problems are more related to her neuropathy and her chronic back issues that is related to foot structure and I do not recommend surgery at this time    Plan:     H&P and x-ray from Dr. Noemi Chapel reviewed. Patient be seen back as needed and at this time is discharged and less any issues should occur and is advised on keeping her feet warm and taking her Lyrica and also having her iron elevated which she is currently having done now
